# Patient Record
Sex: Female | Born: 1950 | Race: White | Hispanic: No | Smoking: Never smoker
Health system: Southern US, Community
[De-identification: ages and names within clinical notes are randomized; demographics above are authoritative.]

## PROBLEM LIST (undated history)

## (undated) DIAGNOSIS — N189 Chronic kidney disease, unspecified: Secondary | ICD-10-CM

## (undated) DIAGNOSIS — L659 Nonscarring hair loss, unspecified: Secondary | ICD-10-CM

## (undated) DIAGNOSIS — K219 Gastro-esophageal reflux disease without esophagitis: Secondary | ICD-10-CM

## (undated) DIAGNOSIS — E785 Hyperlipidemia, unspecified: Secondary | ICD-10-CM

## (undated) DIAGNOSIS — Z972 Presence of dental prosthetic device (complete) (partial): Secondary | ICD-10-CM

## (undated) DIAGNOSIS — M810 Age-related osteoporosis without current pathological fracture: Secondary | ICD-10-CM

## (undated) DIAGNOSIS — M8580 Other specified disorders of bone density and structure, unspecified site: Secondary | ICD-10-CM

## (undated) DIAGNOSIS — E213 Hyperparathyroidism, unspecified: Secondary | ICD-10-CM

## (undated) DIAGNOSIS — Z8719 Personal history of other diseases of the digestive system: Secondary | ICD-10-CM

## (undated) DIAGNOSIS — I10 Essential (primary) hypertension: Secondary | ICD-10-CM

## (undated) DIAGNOSIS — I701 Atherosclerosis of renal artery: Secondary | ICD-10-CM

## (undated) DIAGNOSIS — I2510 Atherosclerotic heart disease of native coronary artery without angina pectoris: Secondary | ICD-10-CM

## (undated) HISTORY — PX: CORONARY ARTERY BYPASS GRAFT: SHX141

## (undated) HISTORY — PX: APPENDECTOMY: SHX54

## (undated) HISTORY — PX: STENT PLACEMENT VASCULAR (ARMC HX): HXRAD1737

## (undated) HISTORY — PX: TONSILLECTOMY: SUR1361

## (undated) HISTORY — PX: PARATHYROIDECTOMY: SHX19

---

## 1998-09-09 ENCOUNTER — Inpatient Hospital Stay: Admission: RE | Admit: 1998-09-09 | Discharge: 1998-09-10 | Payer: Self-pay | Admitting: *Deleted

## 1999-09-08 ENCOUNTER — Observation Stay: Admission: RE | Admit: 1999-09-08 | Discharge: 1999-09-09 | Payer: Self-pay | Admitting: *Deleted

## 1999-09-08 ENCOUNTER — Encounter: Payer: Self-pay | Admitting: *Deleted

## 1999-10-20 ENCOUNTER — Observation Stay: Admission: RE | Admit: 1999-10-20 | Discharge: 1999-10-21 | Payer: Self-pay | Admitting: *Deleted

## 2005-07-13 ENCOUNTER — Ambulatory Visit: Payer: Self-pay | Admitting: Internal Medicine

## 2005-09-01 ENCOUNTER — Emergency Department: Payer: Self-pay | Admitting: Unknown Physician Specialty

## 2007-06-27 ENCOUNTER — Ambulatory Visit: Payer: Self-pay | Admitting: Internal Medicine

## 2008-08-13 ENCOUNTER — Ambulatory Visit: Payer: Self-pay | Admitting: Internal Medicine

## 2009-09-03 ENCOUNTER — Ambulatory Visit: Payer: Self-pay | Admitting: Vascular Surgery

## 2009-10-29 ENCOUNTER — Ambulatory Visit: Payer: Self-pay | Admitting: Vascular Surgery

## 2010-12-08 ENCOUNTER — Ambulatory Visit: Payer: Self-pay | Admitting: Internal Medicine

## 2012-03-08 ENCOUNTER — Ambulatory Visit: Payer: Self-pay | Admitting: Internal Medicine

## 2013-01-03 ENCOUNTER — Ambulatory Visit: Payer: Self-pay | Admitting: Gastroenterology

## 2013-02-28 ENCOUNTER — Ambulatory Visit: Payer: Self-pay | Admitting: Gastroenterology

## 2013-03-14 ENCOUNTER — Ambulatory Visit: Payer: Self-pay | Admitting: Internal Medicine

## 2013-06-06 ENCOUNTER — Ambulatory Visit: Payer: Self-pay | Admitting: Gastroenterology

## 2014-03-20 ENCOUNTER — Ambulatory Visit: Payer: Self-pay | Admitting: Internal Medicine

## 2014-05-17 DIAGNOSIS — M8580 Other specified disorders of bone density and structure, unspecified site: Secondary | ICD-10-CM | POA: Insufficient documentation

## 2014-05-17 DIAGNOSIS — N183 Chronic kidney disease, stage 3 unspecified: Secondary | ICD-10-CM | POA: Insufficient documentation

## 2014-05-17 DIAGNOSIS — I10 Essential (primary) hypertension: Secondary | ICD-10-CM | POA: Insufficient documentation

## 2015-03-30 ENCOUNTER — Ambulatory Visit
Admit: 2015-03-30 | Disposition: A | Discharge status: Home / Self Care | Payer: Self-pay | Attending: Internal Medicine | Admitting: Internal Medicine

## 2016-02-23 DIAGNOSIS — Z0000 Encounter for general adult medical examination without abnormal findings: Secondary | ICD-10-CM | POA: Insufficient documentation

## 2016-04-07 ENCOUNTER — Other Ambulatory Visit: Payer: Self-pay | Admitting: Internal Medicine

## 2016-04-07 DIAGNOSIS — Z1231 Encounter for screening mammogram for malignant neoplasm of breast: Secondary | ICD-10-CM

## 2016-04-15 ENCOUNTER — Ambulatory Visit
Admission: RE | Admit: 2016-04-15 | Discharge: 2016-04-15 | Disposition: A | Discharge status: Home / Self Care | Payer: BC Managed Care – PPO | Source: Ambulatory Visit | Attending: Internal Medicine | Admitting: Internal Medicine

## 2016-04-15 DIAGNOSIS — Z1231 Encounter for screening mammogram for malignant neoplasm of breast: Secondary | ICD-10-CM | POA: Insufficient documentation

## 2017-02-23 ENCOUNTER — Other Ambulatory Visit: Payer: Self-pay | Admitting: Internal Medicine

## 2017-02-23 DIAGNOSIS — Z1231 Encounter for screening mammogram for malignant neoplasm of breast: Secondary | ICD-10-CM

## 2017-04-18 ENCOUNTER — Ambulatory Visit: Payer: BC Managed Care – PPO

## 2017-04-24 ENCOUNTER — Ambulatory Visit
Admission: RE | Admit: 2017-04-24 | Discharge: 2017-04-24 | Disposition: A | Discharge status: Home / Self Care | Payer: BC Managed Care – PPO | Source: Ambulatory Visit | Attending: Internal Medicine | Admitting: Internal Medicine

## 2017-04-24 DIAGNOSIS — Z1231 Encounter for screening mammogram for malignant neoplasm of breast: Secondary | ICD-10-CM | POA: Diagnosis not present

## 2018-03-06 ENCOUNTER — Encounter: Payer: Self-pay | Admitting: *Deleted

## 2018-03-07 ENCOUNTER — Encounter: Payer: Self-pay | Admitting: *Deleted

## 2018-03-07 ENCOUNTER — Ambulatory Visit
Admission: RE | Admit: 2018-03-07 | Discharge: 2018-03-07 | Disposition: A | Discharge status: Home / Self Care | Payer: Medicare Other | Source: Ambulatory Visit | Attending: Internal Medicine | Admitting: Internal Medicine

## 2018-03-07 ENCOUNTER — Other Ambulatory Visit: Payer: Self-pay

## 2018-03-07 ENCOUNTER — Ambulatory Visit: Payer: Medicare Other | Admitting: Anesthesiology

## 2018-03-07 ENCOUNTER — Encounter
Admission: RE | Disposition: A | Discharge status: Home / Self Care | Payer: Self-pay | Source: Ambulatory Visit | Attending: Internal Medicine

## 2018-03-07 DIAGNOSIS — I2510 Atherosclerotic heart disease of native coronary artery without angina pectoris: Secondary | ICD-10-CM | POA: Diagnosis not present

## 2018-03-07 DIAGNOSIS — K449 Diaphragmatic hernia without obstruction or gangrene: Secondary | ICD-10-CM | POA: Diagnosis not present

## 2018-03-07 DIAGNOSIS — Z951 Presence of aortocoronary bypass graft: Secondary | ICD-10-CM | POA: Diagnosis not present

## 2018-03-07 DIAGNOSIS — I129 Hypertensive chronic kidney disease with stage 1 through stage 4 chronic kidney disease, or unspecified chronic kidney disease: Secondary | ICD-10-CM | POA: Diagnosis not present

## 2018-03-07 DIAGNOSIS — Z7902 Long term (current) use of antithrombotics/antiplatelets: Secondary | ICD-10-CM | POA: Insufficient documentation

## 2018-03-07 DIAGNOSIS — K222 Esophageal obstruction: Secondary | ICD-10-CM | POA: Diagnosis not present

## 2018-03-07 DIAGNOSIS — Z955 Presence of coronary angioplasty implant and graft: Secondary | ICD-10-CM | POA: Diagnosis not present

## 2018-03-07 DIAGNOSIS — N189 Chronic kidney disease, unspecified: Secondary | ICD-10-CM | POA: Diagnosis not present

## 2018-03-07 DIAGNOSIS — Z79899 Other long term (current) drug therapy: Secondary | ICD-10-CM | POA: Insufficient documentation

## 2018-03-07 DIAGNOSIS — I739 Peripheral vascular disease, unspecified: Secondary | ICD-10-CM | POA: Insufficient documentation

## 2018-03-07 DIAGNOSIS — R1310 Dysphagia, unspecified: Secondary | ICD-10-CM | POA: Diagnosis present

## 2018-03-07 HISTORY — DX: Nonscarring hair loss, unspecified: L65.9

## 2018-03-07 HISTORY — DX: Atherosclerosis of renal artery: I70.1

## 2018-03-07 HISTORY — DX: Chronic kidney disease, unspecified: N18.9

## 2018-03-07 HISTORY — DX: Essential (primary) hypertension: I10

## 2018-03-07 HISTORY — DX: Atherosclerotic heart disease of native coronary artery without angina pectoris: I25.10

## 2018-03-07 HISTORY — DX: Hyperparathyroidism, unspecified: E21.3

## 2018-03-07 HISTORY — PX: ESOPHAGOGASTRODUODENOSCOPY (EGD) WITH PROPOFOL: SHX5813

## 2018-03-07 SURGERY — ESOPHAGOGASTRODUODENOSCOPY (EGD) WITH PROPOFOL
Anesthesia: General

## 2018-03-07 MED ORDER — LIDOCAINE HCL (PF) 1 % IJ SOLN
INTRAMUSCULAR | Status: AC
  Filled 2018-03-07: qty 2

## 2018-03-07 MED ORDER — PROPOFOL 500 MG/50ML IV EMUL
INTRAVENOUS | Status: AC
  Filled 2018-03-07: qty 50

## 2018-03-07 MED ORDER — SODIUM CHLORIDE 0.9 % IV SOLN
INTRAVENOUS | Status: DC
  Administered 2018-03-07: 10:00:00 via INTRAVENOUS

## 2018-03-07 MED ORDER — LIDOCAINE HCL (PF) 2 % IJ SOLN
INTRAMUSCULAR | Status: AC
  Filled 2018-03-07: qty 10

## 2018-03-07 MED ORDER — PROPOFOL 500 MG/50ML IV EMUL
INTRAVENOUS | Status: DC | PRN
  Administered 2018-03-07: 200 ug/kg/min via INTRAVENOUS

## 2018-03-07 MED ORDER — LIDOCAINE HCL (CARDIAC) 20 MG/ML IV SOLN
INTRAVENOUS | Status: DC | PRN
  Administered 2018-03-07: 100 mg via INTRAVENOUS

## 2018-03-07 MED ORDER — PROPOFOL 10 MG/ML IV BOLUS
INTRAVENOUS | Status: DC | PRN
  Administered 2018-03-07: 60 mg via INTRAVENOUS

## 2018-03-07 NOTE — Anesthesia Preprocedure Evaluation (Signed)
Anesthesia Evaluation  Patient identified by MRN, date of birth, ID band Patient awake    Reviewed: Allergy & Precautions, H&P , NPO status , Patient's Chart, lab work & pertinent test results  History of Anesthesia Complications Negative for: history of anesthetic complications  Airway Mallampati: III  TM Distance: <3 FB Neck ROM: limited    Dental  (+) Chipped, Poor Dentition, Missing, Partial Lower, Upper Dentures   Pulmonary neg pulmonary ROS, neg shortness of breath,           Cardiovascular Exercise Tolerance: Good hypertension, (-) angina+ CAD, + Cardiac Stents, + CABG and + Peripheral Vascular Disease  (-) DOE      Neuro/Psych negative neurological ROS  negative psych ROS   GI/Hepatic negative GI ROS, Neg liver ROS,   Endo/Other  negative endocrine ROS  Renal/GU Renal diseasenegative Renal ROS  negative genitourinary   Musculoskeletal   Abdominal   Peds  Hematology negative hematology ROS (+)   Anesthesia Other Findings Past Medical History: No date: Alopecia No date: Chronic kidney disease     Comment:  history nephrolithiasis No date: Coronary artery disease No date: Hyperparathyroidism ([place])     Comment:  total calcium highest 12.2in 2/05, PTH 67. b. Sestamibi               6/04 revealed no adenoma osteoporosis with t score of               -2.67 in femoral neck and  No date: Hypertension No date: Renal artery stenosis (HCC)  Past Surgical History: No date: APPENDECTOMY No date: CORONARY ARTERY BYPASS GRAFT No date: PARATHYROIDECTOMY     Comment:  particial  No date: TONSILLECTOMY  BMI    Body Mass Index:  18.95 kg/m      Reproductive/Obstetrics negative OB ROS                             Anesthesia Physical Anesthesia Plan  ASA: III  Anesthesia Plan: General   Post-op Pain Management:    Induction: Intravenous  PONV Risk Score and Plan: Propofol  infusion and TIVA  Airway Management Planned: Natural Airway and Nasal Cannula  Additional Equipment:   Intra-op Plan:   Post-operative Plan:   Informed Consent: I have reviewed the patients History and Physical, chart, labs and discussed the procedure including the risks, benefits and alternatives for the proposed anesthesia with the patient or authorized representative who has indicated his/her understanding and acceptance.   Dental Advisory Given  Plan Discussed with: Anesthesiologist, CRNA and Surgeon  Anesthesia Plan Comments: (Patient consented for risks of anesthesia including but not limited to:  - adverse reactions to medications - risk of intubation if required - damage to teeth, lips or other oral mucosa - sore throat or hoarseness - Damage to heart, brain, lungs or loss of life  Patient voiced understanding.)        Anesthesia Quick Evaluation

## 2018-03-07 NOTE — Anesthesia Post-op Follow-up Note (Signed)
Anesthesia QCDR form completed.        

## 2018-03-07 NOTE — Transfer of Care (Signed)
Immediate Anesthesia Transfer of Care Note  Patient: Morgan Ryan  Procedure(s) Performed: ESOPHAGOGASTRODUODENOSCOPY (EGD) WITH PROPOFOL (N/A )  Patient Location: PACU and Endoscopy Unit  Anesthesia Type:General  Level of Consciousness: awake  Airway & Oxygen Therapy: Patient Spontanous Breathing  Post-op Assessment: Report given to RN  Post vital signs: stable  Last Vitals:  Vitals Value Taken Time  BP    Temp    Pulse 66 03/07/2018 11:56 AM  Resp 12 03/07/2018 11:56 AM  SpO2 98 % 03/07/2018 11:56 AM  Vitals shown include unvalidated device data.  Last Pain:  Vitals:   03/07/18 0958  TempSrc: Tympanic  PainSc: 0-No pain         Complications: No apparent anesthesia complications

## 2018-03-07 NOTE — H&P (Signed)
Outpatient short stay form Pre-procedure 03/07/2018 9:39 AM Teodoro K. Alice Reichert, M.D.  Primary Physician: Frazier Richards, M.D.  Reason for visit:  Dysphagia.  History of present illness:  Morgan Ryan is a pleasant 67 y/o female patient presenting with dysphagia for several months. This is located in the chest region.   No current facility-administered medications for this encounter.   Medications Prior to Admission  Medication Sig Dispense Refill Last Dose  . atorvastatin (LIPITOR) 40 MG tablet Take 40 mg by mouth daily.     . Biotin 1000 MCG CHEW Chew by mouth.     . clopidogrel (PLAVIX) 75 MG tablet Take 75 mg by mouth daily.     . propranolol (INDERAL) 20 MG tablet Take 20 mg by mouth 2 (two) times daily.     . raloxifene (EVISTA) 60 MG tablet Take 60 mg by mouth daily.        Allergies  Allergen Reactions  . Crestor [Rosuvastatin Calcium]   . Naproxen      Past Medical History:  Diagnosis Date  . Alopecia   . Chronic kidney disease    history nephrolithiasis  . Coronary artery disease   . Hyperparathyroidism ([place])    total calcium highest 12.2in 2/05, PTH 67. b. Sestamibi 6/04 revealed no adenoma osteoporosis with t score of -2.67 in femoral neck and   . Hypertension   . Renal artery stenosis (HCC)     Review of systems:      Physical Exam  Gen: Alert, oriented. Appears stated age.  HEENT: [place]/AT. PERRLA. Lungs: CTA, no wheezes. CV: RR nl S1, S2. Abd: soft, benign, no masses. BS+ Ext: No edema. Pulses 2+    Planned procedures: eGD. ....The patient understands the nature of the planned procedure, indications, risks, alternatives and potential complications including but not limited to bleeding, infection, perforation, damage to internal organs and possible oversedation/side effects from anesthesia. The patient agrees and gives consent to proceed.  Please refer to procedure notes for findings, recommendations and patient disposition/instructions.    Teodoro  K. Alice Reichert, M.D. Gastroenterology 03/07/2018  9:39 AM

## 2018-03-07 NOTE — Op Note (Signed)
[facility] Gastroenterology Patient Name: Morgan Ryan Procedure Date: 03/07/2018 11:21 AM MRN: [id number] Account #: [id number] Date of Birth: 10-02-1951 Admit Type: Outpatient Age: 67 Room: [facility] ENDO ROOM 3 Gender: Female Note Status: Finalized Procedure:            Upper GI endoscopy Indications:          Dysphagia, Suspected esophageal reflux Providers:            Benay Pike. Raseel Jans MD, MD Medicines:            Propofol per Anesthesia Complications:        No immediate complications. Procedure:            Pre-Anesthesia Assessment:                       - The risks and benefits of the procedure and the                        sedation options and risks were discussed with the                        patient. All questions were answered and informed                        consent was obtained.                       - Patient identification and proposed procedure were                        verified prior to the procedure by the nurse. The                        procedure was verified in the procedure room.                       - ASA Grade Assessment: III - A patient with severe                        systemic disease.                       - After reviewing the risks and benefits, the patient                        was deemed in satisfactory condition to undergo the                        procedure.                       After obtaining informed consent, the endoscope was                        passed under direct vision. Throughout the procedure,                        the patient's blood pressure, pulse, and oxygen                        saturations were monitored continuously. The Endoscope  was introduced through the mouth, and advanced to the                        third part of duodenum. The upper GI endoscopy was                        accomplished without difficulty. The patient tolerated                        the procedure  well. Findings:      One moderate (circumferential scarring or stenosis; an endoscope may       pass) benign-appearing, intrinsic stenosis was found at the       gastroesophageal junction. This measured 1.1 cm (inner diameter) x less       than one cm (in length) and was traversed. A TTS dilator was passed       through the scope. Dilation with a 09-22-11 mm balloon and a 12-13.5-15       mm balloon dilator was performed to 15 mm. The dilation site was       examined following endoscope reinsertion and showed complete resolution       of luminal narrowing. Estimated blood loss was minimal.      A large hiatal hernia was present.      The exam was otherwise without abnormality.      The examined duodenum was normal. Impression:           - Benign-appearing esophageal stenosis. Dilated.                       - Large hiatal hernia.                       - The examination was otherwise normal.                       - Normal examined duodenum.                       - No specimens collected. Recommendation:       - Patient has a contact number available for                        emergencies. The signs and symptoms of potential                        delayed complications were discussed with the patient.                        Return to normal activities tomorrow. Written discharge                        instructions were provided to the patient.                       - Resume previous diet.                       - Continue present medications.                       - Await pathology results.                       -  Return to GI office in 1 month.                       - The findings and recommendations were discussed with                        the patient and their family. Procedure Code(s):    --- Professional ---                       [phone], Esophagogastroduodenoscopy, flexible, transoral;                        with transendoscopic balloon dilation of esophagus                         (less than 30 mm diameter) Diagnosis Code(s):    --- Professional ---                       R13.10, Dysphagia, unspecified                       K44.9, Diaphragmatic hernia without obstruction or                        gangrene                       K22.2, Esophageal obstruction CPT copyright 2016 American Medical Association. All rights reserved. The codes documented in this report are preliminary and upon coder review may  be revised to meet current compliance requirements. Efrain Sella MD, MD 03/07/2018 11:54:12 AM This report has been signed electronically. Number of Addenda: 0 Note Initiated On: 03/07/2018 11:21 AM      [facility]

## 2018-03-07 NOTE — Interval H&P Note (Signed)
History and Physical Interval Note:  03/07/2018 9:40 AM  Morgan Ryan  has presented today for surgery, with the diagnosis of DYSPEPSIA  The various methods of treatment have been discussed with the patient and family. After consideration of risks, benefits and other options for treatment, the patient has consented to  Procedure(s): ESOPHAGOGASTRODUODENOSCOPY (EGD) WITH PROPOFOL (N/A) as a surgical intervention .  The patient's history has been reviewed, patient examined, no change in status, stable for surgery.  I have reviewed the patient's chart and labs.  Questions were answered to the patient's satisfaction.     [place], [place]

## 2018-03-08 LAB — SURGICAL PATHOLOGY

## 2018-03-08 NOTE — Anesthesia Postprocedure Evaluation (Signed)
Anesthesia Post Note  Patient: Morgan Ryan  Procedure(s) Performed: ESOPHAGOGASTRODUODENOSCOPY (EGD) WITH PROPOFOL (N/A )  Patient location during evaluation: Endoscopy Anesthesia Type: General Level of consciousness: awake and alert Pain management: pain level controlled Vital Signs Assessment: post-procedure vital signs reviewed and stable Respiratory status: spontaneous breathing, nonlabored ventilation, respiratory function stable and patient connected to nasal cannula oxygen Cardiovascular status: blood pressure returned to baseline and stable Postop Assessment: no apparent nausea or vomiting Anesthetic complications: no     Last Vitals:  Vitals:   03/07/18 1156 03/07/18 1206  BP: 125/65 120/67  Pulse:  69  Resp:  14  Temp: (!) 36.1 C   SpO2:  98%    Last Pain:  Vitals:   03/08/18 0724  TempSrc:   PainSc: 0-No pain                 Precious Haws Talajah Slimp

## 2018-09-03 ENCOUNTER — Ambulatory Visit (INDEPENDENT_AMBULATORY_CARE_PROVIDER_SITE_OTHER): Payer: Medicare Other | Admitting: Vascular Surgery

## 2018-09-03 ENCOUNTER — Encounter (INDEPENDENT_AMBULATORY_CARE_PROVIDER_SITE_OTHER): Payer: Self-pay | Admitting: Vascular Surgery

## 2018-09-03 DIAGNOSIS — I25810 Atherosclerosis of coronary artery bypass graft(s) without angina pectoris: Secondary | ICD-10-CM | POA: Insufficient documentation

## 2018-09-03 DIAGNOSIS — I25118 Atherosclerotic heart disease of native coronary artery with other forms of angina pectoris: Secondary | ICD-10-CM

## 2018-09-03 DIAGNOSIS — E782 Mixed hyperlipidemia: Secondary | ICD-10-CM

## 2018-09-03 DIAGNOSIS — I739 Peripheral vascular disease, unspecified: Secondary | ICD-10-CM | POA: Diagnosis not present

## 2018-09-03 DIAGNOSIS — I701 Atherosclerosis of renal artery: Secondary | ICD-10-CM | POA: Insufficient documentation

## 2018-09-03 DIAGNOSIS — Z79899 Other long term (current) drug therapy: Secondary | ICD-10-CM

## 2018-09-03 DIAGNOSIS — E785 Hyperlipidemia, unspecified: Secondary | ICD-10-CM | POA: Insufficient documentation

## 2018-09-03 DIAGNOSIS — I70213 Atherosclerosis of native arteries of extremities with intermittent claudication, bilateral legs: Secondary | ICD-10-CM

## 2018-09-03 NOTE — Progress Notes (Signed)
MRN : [id number]  Morgan Ryan is a 66 y.o. ([birth year]) female who presents with chief complaint of  Chief Complaint  Patient presents with  . New Patient (Initial Visit)    ref Ouida Sills for renal artery stenosis  .  History of Present Illness: The patient returns to the office more than 5 years since her last visit for followup regarding renal vascular hypertension and renal artery stenosis as well as PAD. There have been no interval changes in the patient's blood pressure control.  He denies any major changes in is medications.  The patient denies headache or flushing.  No flank or unusual back pain.    I have personally reviewed the historical data from her and vascular.  This information is as follows.  She was initially seen in 2010 for malignant hypertension and was found to have recurrent stenosis in her bilateral existing renal artery stents.  Duplex ultrasound from August 19, 2009 demonstrated greater than 60% stenosis of the renal arteries bilaterally.  She subsequently underwent redo renal stenting initially the left renal artery was treated on September 03, 2009 at which time an atrium covered stent was placed successfully.  Approximately 2 months later she returned on October 29, 2009 and had re-intervention of the right renal artery with an atrium covered stent placed successfully.  Her last follow-up with her in vascular was June 13, 2010 at which time renal artery duplex demonstrated widely patent stents bilaterally and her blood pressure at the time of the visit was 100/70.  During this time.  She also was noted to have claudication-like symptoms.  ABI dated 07/22/2009 showed a right index of 1.09 and a left index 1.12 with triphasic pedal signals.  She was also evaluated for varicose veins and noted to have a duplex ultrasound dated July 22, 2009 that was negative for DVT some scattered reflux was identified.  Since that time she has not followed with a vascular surgeon.  At  her last visit with Dr. Ouida Sills she raised concerns regarding her vascular disease and he requested that I reevaluate her.  There have been no significant changes to the patient's overall health care, her coronary artery disease appears to be stable status post history of CABG no new diagnoses of cancer or other major systemic illness.  No interval shortening of the patient's walking distance or new symptoms consistent with claudication.  The patient denies the  development of rest pain symptoms. No new ulcers or wounds have occurred since the last visit.  The patient denies amaurosis fugax or recent TIA symptoms. There are no recent neurological changes noted. The patient denies history of DVT, PE or superficial thrombophlebitis. The patient denies recent episodes of angina or shortness of breath.      Current Meds  Medication Sig  . atorvastatin (LIPITOR) 40 MG tablet Take 40 mg by mouth daily.  . Biotin 1000 MCG CHEW Chew by mouth.  . clopidogrel (PLAVIX) 75 MG tablet Take 75 mg by mouth daily.  . propranolol (INDERAL) 20 MG tablet Take 20 mg by mouth 2 (two) times daily.  . raloxifene (EVISTA) 60 MG tablet Take 60 mg by mouth daily.    Past Medical History:  Diagnosis Date  . Alopecia   . Chronic kidney disease    history nephrolithiasis  . Coronary artery disease   . Hyperparathyroidism ([place])    total calcium highest 12.2in 2/05, PTH 67. b. Sestamibi 6/04 revealed no adenoma osteoporosis with t score of -2.67 in  femoral neck and   . Hypertension   . Renal artery stenosis [facility])     Past Surgical History:  Procedure Laterality Date  . APPENDECTOMY    . CORONARY ARTERY BYPASS GRAFT    . ESOPHAGOGASTRODUODENOSCOPY (EGD) WITH PROPOFOL N/A 03/07/2018   Procedure: ESOPHAGOGASTRODUODENOSCOPY (EGD) WITH PROPOFOL;  Surgeon: [place], Benay Pike, MD;  Location: [facility] ENDOSCOPY;  Service: Gastroenterology;  Laterality: N/A;  . PARATHYROIDECTOMY     particial   . TONSILLECTOMY       Social History Social History   Tobacco Use  . Smoking status: Never Smoker  . Smokeless tobacco: Never Used  Substance Use Topics  . Alcohol use: Never    Frequency: Never  . Drug use: Never    Family History Family History  Problem Relation Age of Onset  . Heart disease Mother   . Hypertension Father   . Hyperlipidemia Father   . Cancer Father   . Diabetes Father   . Heart attack Father   . Heart disease Paternal Grandmother   No family history of bleeding/clotting disorders, porphyria or autoimmune disease   Allergies  Allergen Reactions  . Crestor [Rosuvastatin Calcium]   . Naproxen      REVIEW OF SYSTEMS (Negative unless checked)  Constitutional: [] Weight loss  [] Fever  [] Chills Cardiac: [] Chest pain   [] Chest pressure   [] Palpitations   [] Shortness of breath when laying flat   [] Shortness of breath with exertion. Vascular:  [x] Pain in legs with walking   [] Pain in legs at rest  [] History of DVT   [] Phlebitis   [] Swelling in legs   [x] Varicose veins   [] Non-healing ulcers Pulmonary:   [] Uses home oxygen   [] Productive cough   [] Hemoptysis   [] Wheeze  [] COPD   [] Asthma Neurologic:  [] Dizziness   [] Seizures   [] History of stroke   [] History of TIA  [] Aphasia   [] Vissual changes   [] Weakness or numbness in arm   [] Weakness or numbness in leg Musculoskeletal:   [] Joint swelling   [x] Joint pain   [] Low back pain Hematologic:  [] Easy bruising  [] Easy bleeding   [] Hypercoagulable state   [] Anemic Gastrointestinal:  [] Diarrhea   [] Vomiting  [] Gastroesophageal reflux/heartburn   [] Difficulty swallowing. Genitourinary:  [] Chronic kidney disease   [] Difficult urination  [] Frequent urination   [] Blood in urine Skin:  [] Rashes   [] Ulcers  Psychological:  [] History of anxiety   []  History of major depression.  Physical Examination  Vitals:   09/03/18 1031  BP: 124/84  Pulse: 69  Resp: 16  Weight: 108 lb (49 kg)  Height: 5' 3.5" (1.613 m)   Body mass index is 18.83  kg/m. Gen: WD/WN, NAD Head: [place]/AT, No temporalis wasting.  Ear/Nose/Throat: Hearing grossly intact, nares w/o erythema or drainage, poor dentition Eyes: PER, EOMI, sclera nonicteric.  Neck: Supple, no masses.  No bruit or JVD.  Pulmonary:  Good air movement, clear to auscultation bilaterally, no use of accessory muscles.  Cardiac: RRR, normal S1, S2, no Murmurs. Vascular: Sporadic varicosities present bilaterally, 5 to 10 mm in diameter.  Mild venous stasis changes to the legs bilaterally.  2+ soft pitting edema Vessel Right Left  Radial Palpable Palpable  PT Trace Palpable Trace Palpable  DP Trace Palpable tracePalpable  Gastrointestinal: soft, non-distended. No guarding/no peritoneal signs.  Musculoskeletal: M/S 5/5 throughout.  No deformity or atrophy.  Neurologic: CN 2-12 intact. Pain and light touch intact in extremities.  Symmetrical.  Speech is fluent. Motor exam as listed above. Psychiatric: Judgment intact,  Mood & affect appropriate for pt's clinical situation. Dermatologic: Mild venous rashes no ulcers noted.  No changes consistent with cellulitis. Lymph : No Cervical lymphadenopathy, no lichenification or skin changes of chronic lymphedema.  CBC No results found for: WBC, HGB, HCT, MCV, PLT  BMET No results found for: NA, K, CL, CO2, GLUCOSE, BUN, CREATININE, CALCIUM, GFRNONAA, GFRAA CrCl cannot be calculated (No successful lab value found.).  COAG No results found for: INR, PROTIME  Radiology No results found.  Outside Studies/Documentation 24 pages of outside documents were reviewed.  They showed the operative reports of her re-interventions I personally reviewed these as well as the noninvasive studies demonstrating her ABIs and venous scans as well as the pre-and post intervention renal artery duplexes.  Office notes were also reviewed.  My findings are stated above.  Assessment/Plan 1. Renal artery stenosis (HCC) BP today was acceptable with systolic reading  <062 and diastolic reading <37 while taking 3 medications.  Given that optimal control of the patient's hypertension is important to minimize the risk of heart attack and/or CVA.  The patient's is overdo for her noninvasive studies to evaluate for the possibility of a hemodynamically significant stricture or stenosis.  Duplex ultrasound was discussed with the patient, the risks and benefits were reviewed and all questions were answered.  The patient has agreed to proceed with ultrasound.    The patient will continue the current medications, no changes at this time.  The primary medical service will continue aggressive antihypertensive therapy as per the [facility] guidelines    A total of 70 minutes was spent with this patient and greater than 50% was spent in counseling and coordination of care with the patient.  Discussion included the treatment options for vascular disease including indications for surgery and intervention.  Also discussed is the appropriate timing of treatment.  In addition medical therapy was discussed.  - VAS US RENAL ARTERY DUPLEX; Future  2. PAD (peripheral artery disease) (HCC)  Recommend:  The patient has evidence of atherosclerosis of the lower extremities with claudication.  The patient does not voice lifestyle limiting changes at this point in time.  Noninvasive studies are overdo  No invasive studies, angiography or surgery at this time.  The patient should continue walking and begin a more formal exercise program.  The patient should continue antiplatelet therapy and aggressive treatment of the lipid abnormalities  No changes in the patient's medications at this time  The patient should continue wearing graduated compression socks 10-15 mmHg strength to control the mild edema.   - VAS [place] ABI WITH/WO TBI; Future  3. Coronary artery disease of native artery of native heart with stable angina pectoris (HCC) Continue cardiac and antihypertensive medications as  already ordered and reviewed, no changes at this time.  Continue statin as ordered and reviewed, no changes at this time  Nitrates PRN for chest pain   4. Mixed hyperlipidemia Continue statin as ordered and reviewed, no changes at this time     Hortencia Pilar, MD  09/03/2018 12:25 PM

## 2018-10-01 ENCOUNTER — Encounter (INDEPENDENT_AMBULATORY_CARE_PROVIDER_SITE_OTHER): Payer: Medicare Other

## 2018-10-08 ENCOUNTER — Encounter (INDEPENDENT_AMBULATORY_CARE_PROVIDER_SITE_OTHER): Payer: Self-pay | Admitting: Vascular Surgery

## 2018-10-08 ENCOUNTER — Ambulatory Visit (INDEPENDENT_AMBULATORY_CARE_PROVIDER_SITE_OTHER): Payer: Medicare Other

## 2018-10-08 ENCOUNTER — Ambulatory Visit (INDEPENDENT_AMBULATORY_CARE_PROVIDER_SITE_OTHER): Payer: Medicare Other | Admitting: Vascular Surgery

## 2018-10-08 VITALS — BP 149/70 | HR 50 | Resp 16 | Ht 63.5 in | Wt 108.6 lb

## 2018-10-08 DIAGNOSIS — I739 Peripheral vascular disease, unspecified: Secondary | ICD-10-CM

## 2018-10-08 DIAGNOSIS — E782 Mixed hyperlipidemia: Secondary | ICD-10-CM

## 2018-10-08 DIAGNOSIS — I701 Atherosclerosis of renal artery: Secondary | ICD-10-CM | POA: Diagnosis not present

## 2018-10-08 DIAGNOSIS — I25708 Atherosclerosis of coronary artery bypass graft(s), unspecified, with other forms of angina pectoris: Secondary | ICD-10-CM | POA: Diagnosis not present

## 2018-10-18 ENCOUNTER — Encounter (INDEPENDENT_AMBULATORY_CARE_PROVIDER_SITE_OTHER): Payer: Self-pay | Admitting: Vascular Surgery

## 2018-10-18 NOTE — Progress Notes (Signed)
MRN : [id number]  Morgan Ryan is a 67 y.o. (Jan 22, 1951) female who presents with chief complaint of  Chief Complaint  Patient presents with  . Follow-up    ultrasound follow up  .  History of Present Illness:    The patient returns to the office more than 5 years since her last visit for followup regarding renal vascular hypertension and renal artery stenosis as well as PAD. There have been no interval changes in the patient's blood pressure control.  He denies any major changes in is medications.  The patient denies headache or flushing.  No flank or unusual back pain.    There have been no significant changes to the patient's overall health care.  The patient denies amaurosis fugax or recent TIA symptoms. There are no recent neurological changes noted. The patient denies history of DVT, PE or superficial thrombophlebitis. The patient denies recent episodes of angina or shortness of breath.   ABI Rt=1.01 and Lt=1.11  (previous ABI's Rt=1.09 and Lt=1.12) Duplex ultrasound of the renal arteries shows moderate bilateral stenosis, no change compared to last study  Current Meds  Medication Sig  . atorvastatin (LIPITOR) 40 MG tablet Take 40 mg by mouth daily.  . Biotin 1000 MCG CHEW Chew by mouth.  . clopidogrel (PLAVIX) 75 MG tablet Take 75 mg by mouth daily.  . propranolol (INDERAL) 20 MG tablet Take 20 mg by mouth 2 (two) times daily.  . raloxifene (EVISTA) 60 MG tablet Take 60 mg by mouth daily.    Past Medical History:  Diagnosis Date  . Alopecia   . Chronic kidney disease    history nephrolithiasis  . Coronary artery disease   . Hyperparathyroidism ([place])    total calcium highest 12.2in 2/05, PTH 68. b. Sestamibi 6/04 revealed no adenoma osteoporosis with t score of -2.67 in femoral neck and   . Hypertension   . Renal artery stenosis [facility])     Past Surgical History:  Procedure Laterality Date  . APPENDECTOMY    . CORONARY ARTERY BYPASS GRAFT    .  ESOPHAGOGASTRODUODENOSCOPY (EGD) WITH PROPOFOL N/A 03/07/2018   Procedure: ESOPHAGOGASTRODUODENOSCOPY (EGD) WITH PROPOFOL;  Surgeon: Toledo, Benay Pike, MD;  Location: [facility] ENDOSCOPY;  Service: Gastroenterology;  Laterality: N/A;  . PARATHYROIDECTOMY     particial   . TONSILLECTOMY      Social History Social History   Tobacco Use  . Smoking status: Never Smoker  . Smokeless tobacco: Never Used  Substance Use Topics  . Alcohol use: Never    Frequency: Never  . Drug use: Never    Family History Family History  Problem Relation Age of Onset  . Heart disease Mother   . Hypertension Father   . Hyperlipidemia Father   . Cancer Father   . Diabetes Father   . Heart attack Father   . Heart disease Paternal Grandmother     Allergies  Allergen Reactions  . Crestor [Rosuvastatin Calcium]   . Naproxen      REVIEW OF SYSTEMS (Negative unless checked)  Constitutional: [] Weight loss  [] Fever  [] Chills Cardiac: [] Chest pain   [] Chest pressure   [] Palpitations   [] Shortness of breath when laying flat   [] Shortness of breath with exertion. Vascular:  [x] Pain in legs with walking   [] Pain in legs at rest  [] History of DVT   [] Phlebitis   [] Swelling in legs   [] Varicose veins   [] Non-healing ulcers Pulmonary:   [] Uses home oxygen   [] Productive cough   []   Hemoptysis   [] Wheeze  [] COPD   [] Asthma Neurologic:  [] Dizziness   [] Seizures   [] History of stroke   [] History of TIA  [] Aphasia   [] Vissual changes   [] Weakness or numbness in arm   [] Weakness or numbness in leg Musculoskeletal:   [] Joint swelling   [] Joint pain   [] Low back pain Hematologic:  [] Easy bruising  [] Easy bleeding   [] Hypercoagulable state   [] Anemic Gastrointestinal:  [] Diarrhea   [] Vomiting  [] Gastroesophageal reflux/heartburn   [] Difficulty swallowing. Genitourinary:  [] Chronic kidney disease   [] Difficult urination  [] Frequent urination   [] Blood in urine Skin:  [] Rashes   [] Ulcers  Psychological:  [] History of anxiety    []  History of major depression.  Physical Examination  Vitals:   10/08/18 0847  BP: (!) 149/70  Pulse: (!) 50  Resp: 16  Weight: 108 lb 9.6 oz (49.3 kg)  Height: 5' 3.5" (1.613 m)   Body mass index is 18.94 kg/m. Gen: WD/WN, NAD Head: [place]/AT, No temporalis wasting.  Ear/Nose/Throat: Hearing grossly intact, nares w/o erythema or drainage Eyes: PER, EOMI, sclera nonicteric.  Neck: Supple, no large masses.   Pulmonary:  Good air movement, no audible wheezing bilaterally, no use of accessory muscles.  Cardiac: RRR, no JVD Vascular:  Vessel Right Left  Radial Palpable Palpable  PT Trace Palpable Trace Palpable  DP Trace Palpable Trace Palpable  Gastrointestinal: Non-distended. No guarding/no peritoneal signs.  Musculoskeletal: M/S 5/5 throughout.  No deformity or atrophy.  Neurologic: CN 2-12 intact. Symmetrical.  Speech is fluent. Motor exam as listed above. Psychiatric: Judgment intact, Mood & affect appropriate for pt's clinical situation. Dermatologic: No rashes or ulcers noted.  No changes consistent with cellulitis. Lymph : No lichenification or skin changes of chronic lymphedema.  CBC No results found for: WBC, HGB, HCT, MCV, PLT  BMET No results found for: NA, K, CL, CO2, GLUCOSE, BUN, CREATININE, CALCIUM, GFRNONAA, GFRAA CrCl cannot be calculated (No successful lab value found.).  COAG No results found for: INR, PROTIME  Radiology Vas [place] Abi With/wo Tbi  Result Date: 10/08/2018 LOWER EXTREMITY DOPPLER STUDY Indications: Peripheral artery disease. Other Factors: Patient states no problems with legs.  Vascular Interventions: None. Performing Technologist: Blondell Reveal RT, RDMS, RVT  Examination Guidelines: A complete evaluation includes at minimum, Doppler waveform signals and systolic blood pressure reading at the level of bilateral brachial, anterior tibial, and posterior tibial arteries, when vessel segments are accessible. Bilateral testing is considered an  integral part of a complete examination. Photoelectric Plethysmograph (PPG) waveforms and toe systolic pressure readings are included as required and additional duplex testing as needed. Limited examinations for reoccurring indications may be performed as noted.  ABI Findings: +--------+------------------+-----+---------+--------+ Right   Rt Pressure (mmHg)IndexWaveform Comment  +--------+------------------+-----+---------+--------+ [id number]                                      +--------+------------------+-----+---------+--------+ ATA     138               1.01 triphasic         +--------+------------------+-----+---------+--------+ PTA     131               0.96 triphasic         +--------+------------------+-----+---------+--------+ +--------+------------------+-----+---------+-------+ Left    Lt Pressure (mmHg)IndexWaveform Comment +--------+------------------+-----+---------+-------+ [id number]                                     +--------+------------------+-----+---------+-------+  ATA     152               1.11 triphasic        +--------+------------------+-----+---------+-------+ PTA     140               1.02 triphasic        +--------+------------------+-----+---------+-------+ +-------+-----------+-----------+------------+------------+ ABI/TBIToday's ABIToday's TBIPrevious ABIPrevious TBI +-------+-----------+-----------+------------+------------+ Right  1.01                  1.09                     +-------+-----------+-----------+------------+------------+ Left   1.11                  1.12                     +-------+-----------+-----------+------------+------------+ Bilateral ABIs appear essentially unchanged compared to prior study on 07/22/09.  Summary: Right: Resting right ankle-brachial index is within normal range. No evidence of significant right lower extremity arterial disease. Left: Resting left ankle-brachial index is  within normal range. No evidence of significant left lower extremity arterial disease.  *See table(s) above for measurements and observations.  Electronically signed by Hortencia Pilar MD on 10/08/2018 at 4:37:08 PM.   Final    Vas US Renal Artery Duplex  Result Date: 10/08/2018 ABDOMINAL VISCERAL Indications: Renal artery stenosis Vascular Interventions: H/O bilateral renal artery stents in 2010. Limitations: Air/bowel gas. Comparison Study: Ultrasound on 06/13/10: Widely patent renal artery stents Performing Technologist: Blondell Reveal RT, RDMS, RVT  Examination Guidelines: A complete evaluation includes B-mode imaging, spectral Doppler, color Doppler, and power Doppler as needed of all accessible portions of each vessel. Bilateral testing is considered an integral part of a complete examination. Limited examinations for reoccurring indications may be performed as noted.  Duplex Findings: +---------+--------+--------+------+--------+          PSV cm/sEDV cm/sPlaqueComments +---------+--------+--------+------+--------+ Aorta Mid   68                          +---------+--------+--------+------+--------+  +------------------+--------+--------+-------+ Right Renal ArteryPSV cm/sEDV cm/sComment +------------------+--------+--------+-------+ Proximal            236      55           +------------------+--------+--------+-------+ Mid                 101      13           +------------------+--------+--------+-------+ Distal               57      14           +------------------+--------+--------+-------+ +-----------------+--------+--------+-------+ Left Renal ArteryPSV cm/sEDV cm/sComment +-----------------+--------+--------+-------+ Proximal           251      82           +-----------------+--------+--------+-------+ Mid                 64      22           +-----------------+--------+--------+-------+ Distal              84      22            +-----------------+--------+--------+-------+ Technologist observations:Bilateral renal artery stents were not adequately visualized.  +------------------+----+------------------+----+ Right Kidney          Left Kidney            +------------------+----+------------------+----+  RAR                   RAR                    +------------------+----+------------------+----+ RAR (manual)      3.5 RAR (manual)      3.7  +------------------+----+------------------+----+ Cortex                Cortex                 +------------------+----+------------------+----+ Cortex thickness      Corex thickness        +------------------+----+------------------+----+ Kidney length (cm)9.20Kidney length (cm)9.00 +------------------+----+------------------+----+  Summary: Renal:  Right: Evidence of patent renal artery stent with velocities        suggestive of a >60% proximal renal artery stenosis. Normal        size right kidney. RRV flow present. Left:  Evidence of patent renal artery stent with velocities        suggestive of a >60% proximal renal artery stenosis. Normal        size of left kidney. LRV flow present.  *See table(s) above for measurements and observations.  Diagnosing physician: Hortencia Pilar MD  Electronically signed by Hortencia Pilar MD on 10/08/2018 at 4:37:17 PM.    Final      Assessment/Plan 1. Renal artery stenosis (HCC) BP today was acceptable with systolic reading <315 and diastolic reading <17 while taking 3 medications.  Given that optimal control of the patient's hypertension is important to minimize the risk of heart attack and/or CVA.  The patient's is overdo for her noninvasive studies to evaluate for the possibility of a hemodynamically significant stricture or stenosis.  Duplex ultrasound was discussed with the patient, the risks and benefits were reviewed and all questions were answered.  The patient has agreed to proceed with ultrasound.    The  patient will continue the current medications, no changes at this time.  The primary medical service will continue aggressive antihypertensive therapy as per the AHA guidelines   - VAS US RENAL ARTERY DUPLEX; Future  2. PAD (peripheral artery disease) (HCC) Recommend:  The patient has evidence of atherosclerosis of the lower extremities with claudication.  The patient does not voice lifestyle limiting changes at this point in time.  Noninvasive studies are overdo  No invasive studies, angiography or surgery at this time.  The patient should continue walking and begin a more formal exercise program.  The patient should continue antiplatelet therapy and aggressive treatment of the lipid abnormalities  No changes in the patient's medications at this time  The patient should continue wearing graduated   3. Coronary artery disease of bypass graft of native heart with stable angina pectoris (HCC) Continue cardiac and antihypertensive medications as already ordered and reviewed, no changes at this time.  Continue statin as ordered and reviewed, no changes at this time  Nitrates PRN for chest pain   4. Mixed hyperlipidemia Continue statin as ordered and reviewed, no changes at this time     Hortencia Pilar, MD  10/18/2018 10:23 PM

## 2019-09-05 ENCOUNTER — Other Ambulatory Visit: Payer: Self-pay | Admitting: Internal Medicine

## 2019-09-05 DIAGNOSIS — Z1231 Encounter for screening mammogram for malignant neoplasm of breast: Secondary | ICD-10-CM

## 2019-09-09 ENCOUNTER — Other Ambulatory Visit: Payer: Self-pay | Admitting: Sports Medicine

## 2019-09-09 DIAGNOSIS — M25461 Effusion, right knee: Secondary | ICD-10-CM

## 2019-09-09 DIAGNOSIS — G8929 Other chronic pain: Secondary | ICD-10-CM

## 2019-09-20 ENCOUNTER — Ambulatory Visit
Admission: RE | Admit: 2019-09-20 | Discharge: 2019-09-20 | Disposition: A | Discharge status: Home / Self Care | Payer: Medicare Other | Source: Ambulatory Visit | Attending: Sports Medicine | Admitting: Sports Medicine

## 2019-09-20 ENCOUNTER — Other Ambulatory Visit: Payer: Self-pay

## 2019-09-20 DIAGNOSIS — M25461 Effusion, right knee: Secondary | ICD-10-CM | POA: Insufficient documentation

## 2019-09-20 DIAGNOSIS — G8929 Other chronic pain: Secondary | ICD-10-CM

## 2019-09-20 DIAGNOSIS — M25561 Pain in right knee: Secondary | ICD-10-CM | POA: Insufficient documentation

## 2019-10-09 ENCOUNTER — Ambulatory Visit
Admission: RE | Admit: 2019-10-09 | Discharge: 2019-10-09 | Disposition: A | Discharge status: Home / Self Care | Payer: Medicare Other | Source: Ambulatory Visit | Attending: Internal Medicine | Admitting: Internal Medicine

## 2019-10-09 DIAGNOSIS — Z1231 Encounter for screening mammogram for malignant neoplasm of breast: Secondary | ICD-10-CM | POA: Diagnosis not present

## 2019-10-10 ENCOUNTER — Ambulatory Visit (INDEPENDENT_AMBULATORY_CARE_PROVIDER_SITE_OTHER): Payer: Medicare Other | Admitting: Vascular Surgery

## 2019-10-10 ENCOUNTER — Encounter (INDEPENDENT_AMBULATORY_CARE_PROVIDER_SITE_OTHER): Payer: Medicare Other

## 2019-10-28 ENCOUNTER — Encounter (INDEPENDENT_AMBULATORY_CARE_PROVIDER_SITE_OTHER): Payer: Self-pay | Admitting: Vascular Surgery

## 2019-10-28 ENCOUNTER — Other Ambulatory Visit: Payer: Self-pay

## 2019-10-28 ENCOUNTER — Ambulatory Visit (INDEPENDENT_AMBULATORY_CARE_PROVIDER_SITE_OTHER): Payer: Medicare Other

## 2019-10-28 ENCOUNTER — Ambulatory Visit (INDEPENDENT_AMBULATORY_CARE_PROVIDER_SITE_OTHER): Payer: Medicare Other | Admitting: Vascular Surgery

## 2019-10-28 VITALS — BP 155/75 | HR 64 | Resp 16 | Wt 110.4 lb

## 2019-10-28 DIAGNOSIS — I701 Atherosclerosis of renal artery: Secondary | ICD-10-CM

## 2019-10-28 DIAGNOSIS — I739 Peripheral vascular disease, unspecified: Secondary | ICD-10-CM

## 2019-10-28 DIAGNOSIS — I10 Essential (primary) hypertension: Secondary | ICD-10-CM

## 2019-10-28 DIAGNOSIS — I25708 Atherosclerosis of coronary artery bypass graft(s), unspecified, with other forms of angina pectoris: Secondary | ICD-10-CM

## 2019-10-28 DIAGNOSIS — E782 Mixed hyperlipidemia: Secondary | ICD-10-CM

## 2019-10-28 NOTE — Progress Notes (Signed)
MRN : [id number]  Morgan Ryan is a 68 y.o. (22-Apr-1951) female who presents with chief complaint of  Chief Complaint  Patient presents with  . Follow-up    ultrasound follow up  .  History of Present Illness:  The patient returns to the [facility] than 5 years since her last visitfor followup regarding renal vascular hypertension and renal artery stenosisas well as PAD. There have been no interval changes in the patient's blood pressure control. He denies any major changes in is medications. The patient denies headache or flushing. No flank or unusual back pain.   There have been no significant changes to the patient's overall health care.  The patient denies amaurosis fugax or recent TIA symptoms. There are no recent neurological changes noted. The patient denies history of DVT, PE or superficial thrombophlebitis. The patient denies recent episodes of angina or shortness of breath.   Previous ABI Rt=1.01 and Lt=1.11  (previous ABI's Rt=1.09 and Lt=1.12)  Duplex ultrasound of the renal arteries shows moderate bilateral stenosis, maybe slightly worse on the left compared to last study  Current Meds  Medication Sig  . atorvastatin (LIPITOR) 40 MG tablet Take 40 mg by mouth daily.  . Biotin 1000 MCG CHEW Chew by mouth.  . clopidogrel (PLAVIX) 75 MG tablet Take 75 mg by mouth daily.  . propranolol (INDERAL) 20 MG tablet Take 20 mg by mouth 2 (two) times daily.  . raloxifene (EVISTA) 60 MG tablet Take 60 mg by mouth daily.    Past Medical History:  Diagnosis Date  . Alopecia   . Chronic kidney disease    history nephrolithiasis  . Coronary artery disease   . Hyperparathyroidism ([place])    total calcium highest 12.2in 2/05, PTH 68. b. Sestamibi 6/04 revealed no adenoma osteoporosis with t score of -2.67 in femoral neck and   . Hypertension   . Renal artery stenosis [facility])     Past Surgical History:  Procedure Laterality Date  . APPENDECTOMY    . CORONARY  ARTERY BYPASS GRAFT    . ESOPHAGOGASTRODUODENOSCOPY (EGD) WITH PROPOFOL N/A 03/07/2018   Procedure: ESOPHAGOGASTRODUODENOSCOPY (EGD) WITH PROPOFOL;  Surgeon: Toledo, Benay Pike, MD;  Location: [facility] ENDOSCOPY;  Service: Gastroenterology;  Laterality: N/A;  . PARATHYROIDECTOMY     particial   . TONSILLECTOMY      Social History Social History   Tobacco Use  . Smoking status: Never Smoker  . Smokeless tobacco: Never Used  Substance Use Topics  . Alcohol use: Never    Frequency: Never  . Drug use: Never    Family History Family History  Problem Relation Age of Onset  . Heart disease Mother   . Hypertension Father   . Hyperlipidemia Father   . Cancer Father   . Diabetes Father   . Heart attack Father   . Heart disease Paternal Grandmother     Allergies  Allergen Reactions  . Crestor [Rosuvastatin Calcium]   . Naproxen      REVIEW OF SYSTEMS (Negative unless checked)  Constitutional: [] Weight loss  [] Fever  [] Chills Cardiac: [] Chest pain   [] Chest pressure   [] Palpitations   [] Shortness of breath when laying flat   [] Shortness of breath with exertion. Vascular:  [] Pain in legs with walking   [] Pain in legs at rest  [] History of DVT   [] Phlebitis   [] Swelling in legs   [] Varicose veins   [] Non-healing ulcers Pulmonary:   [] Uses home oxygen   [] Productive cough   [] Hemoptysis   []   Wheeze  [] COPD   [] Asthma Neurologic:  [] Dizziness   [] Seizures   [] History of stroke   [] History of TIA  [] Aphasia   [] Vissual changes   [] Weakness or numbness in arm   [] Weakness or numbness in leg Musculoskeletal:   [] Joint swelling   [] Joint pain   [] Low back pain Hematologic:  [] Easy bruising  [] Easy bleeding   [] Hypercoagulable state   [] Anemic Gastrointestinal:  [] Diarrhea   [] Vomiting  [] Gastroesophageal reflux/heartburn   [] Difficulty swallowing. Genitourinary:  [] Chronic kidney disease   [] Difficult urination  [] Frequent urination   [] Blood in urine Skin:  [] Rashes   [] Ulcers   Psychological:  [] History of anxiety   []  History of major depression.  Physical Examination  Vitals:   10/28/19 0937  BP: (!) 155/75  Pulse: 64  Resp: 16  Weight: 110 lb 6.4 oz (50.1 kg)   Body mass index is 19.25 kg/m. Gen: WD/WN, NAD Head: [place]/AT, No temporalis wasting.  Ear/Nose/Throat: Hearing grossly intact, nares w/o erythema or drainage Eyes: PER, EOMI, sclera nonicteric.  Neck: Supple, no large masses.   Pulmonary:  Good air movement, no audible wheezing bilaterally, no use of accessory muscles.  Cardiac: RRR, no JVD Vascular: no renal bruit Vessel Right Left  Radial Palpable Palpable  PT Not Palpable Not Palpable  DP Not Palpable Not Palpable  Gastrointestinal: Non-distended. No guarding/no peritoneal signs.  Musculoskeletal: M/S 5/5 throughout.  No deformity or atrophy.  Neurologic: CN 2-12 intact. Symmetrical.  Speech is fluent. Motor exam as listed above. Psychiatric: Judgment intact, Mood & affect appropriate for pt's clinical situation. Dermatologic: No rashes or ulcers noted.  No changes consistent with cellulitis. Lymph : No lichenification or skin changes of chronic lymphedema.  CBC No results found for: WBC, HGB, HCT, MCV, PLT  BMET No results found for: NA, K, CL, CO2, GLUCOSE, BUN, CREATININE, CALCIUM, GFRNONAA, GFRAA CrCl cannot be calculated (No successful lab value found.).  COAG No results found for: INR, PROTIME  Radiology Mm 3d Screen Breast Bilateral  Result Date: 10/09/2019 CLINICAL DATA:  Screening. EXAM: DIGITAL SCREENING BILATERAL MAMMOGRAM WITH TOMO AND CAD COMPARISON:  Previous exam(s). ACR Breast Density Category b: There are scattered areas of fibroglandular density. FINDINGS: There are no findings suspicious for malignancy. Images were processed with CAD. IMPRESSION: No mammographic evidence of malignancy. A result letter of this screening mammogram will be mailed directly to the patient. RECOMMENDATION: Screening mammogram in one  year. (Code:SM-B-01Y) BI-RADS CATEGORY  1: Negative. Electronically Signed   By: Lillia Mountain M.D.   On: 10/09/2019 13:50     Assessment/Plan 1. Renal artery stenosis (HCC) BP today was acceptable with systolic [id number] and diastolic [id number] while taking 3 medications.  Given that optimal control of the patient's hypertension is important to minimize the risk of heart attack and/or CVA.  The patient'sis overdo for hernoninvasive studiesto evaluate forthe possibility of a hemodynamically significant stricture or stenosis.  Duplex ultrasoundwas discussed with the patient, the risks and benefits were reviewed and all questions were answered. The patient has agreed to proceed with ultrasound.  The patient will continue the current medications, no changes at this time.  The primary medical service will continue aggressive antihypertensive therapy as per the AHA guidelines  - Renal; Future  2. PAD (peripheral artery disease) (HCC) Recommend:  The patient has evidence of atherosclerosis of the lower extremities with claudication. The patient does not voice lifestyle limiting changes at this point in time.  Noninvasive studiesare overdo  No invasive studies, angiography or  surgery at this time.  The patient should continue walking and begin a more formal exercise program.  The patient should continue antiplatelet therapy and aggressive treatment of the lipid abnormalities  No changes in the patient's medications at this time  The patient should continue wearing graduated   3. HTN (hypertension), benign Continue antihypertensive medications as already ordered, these medications have been reviewed and there are no changes at this time.   4. Coronary artery disease of bypass graft of native heart with stable angina pectoris ([place]) Continue cardiac and antihypertensive medications as already ordered and reviewed, no changes at this time.  Continue statin as  ordered and reviewed, no changes at this time  Nitrates PRN for chest pain   5. Mixed hyperlipidemia Continue statin as ordered and reviewed, no changes at this time   Hortencia Pilar, MD  10/28/2019 9:39 AM

## 2020-01-12 ENCOUNTER — Ambulatory Visit: Payer: Medicare Other

## 2020-01-13 ENCOUNTER — Encounter (INDEPENDENT_AMBULATORY_CARE_PROVIDER_SITE_OTHER): Payer: Medicare Other

## 2020-01-13 ENCOUNTER — Ambulatory Visit (INDEPENDENT_AMBULATORY_CARE_PROVIDER_SITE_OTHER): Payer: Medicare Other | Admitting: Vascular Surgery

## 2020-01-20 ENCOUNTER — Ambulatory Visit: Payer: Medicare Other

## 2020-02-02 ENCOUNTER — Ambulatory Visit: Payer: Medicare Other

## 2020-02-09 IMAGING — MG DIGITAL SCREENING BILAT W/ TOMO
8 series · 9 of 24 positions shown · non-contrast
Comparison: Previous exam(s).

CLINICAL DATA: Screening.

EXAM:
DIGITAL SCREENING BILATERAL MAMMOGRAM WITH TOMO AND CAD

[L MLO synth-2D]
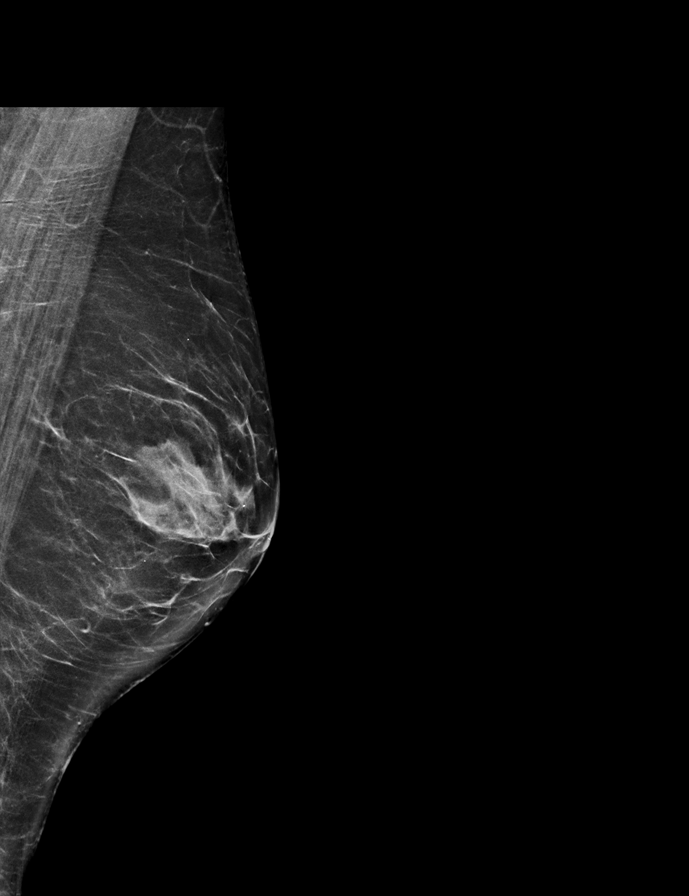

[R MLO synth-2D]
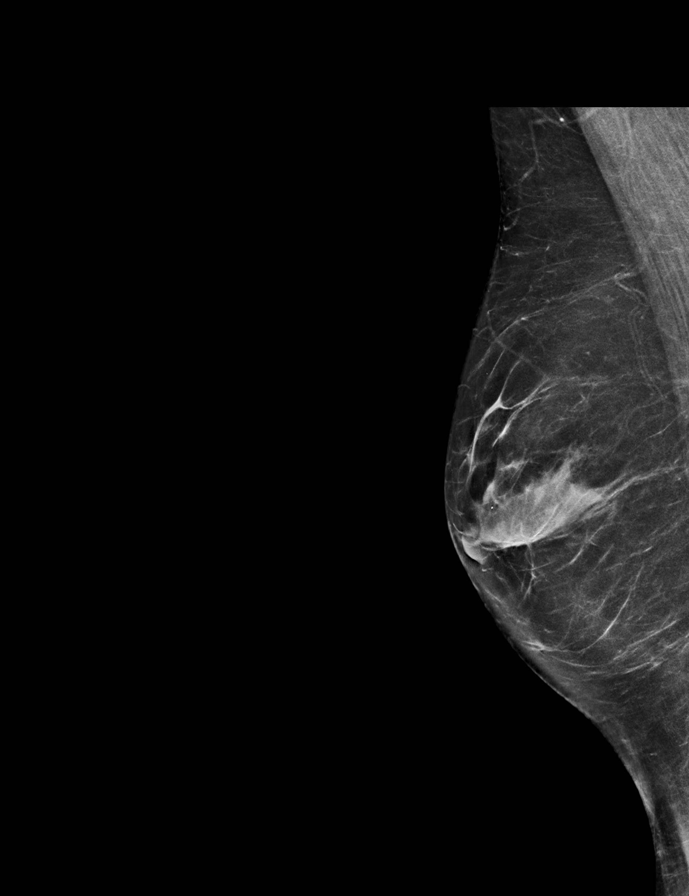

[L CC synth-2D]
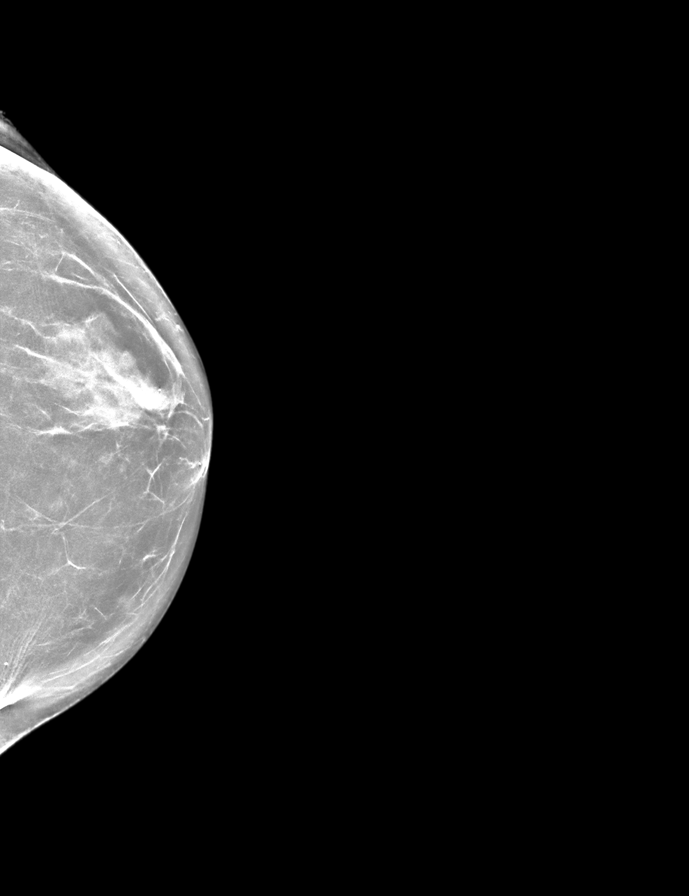

[R CC synth-2D]
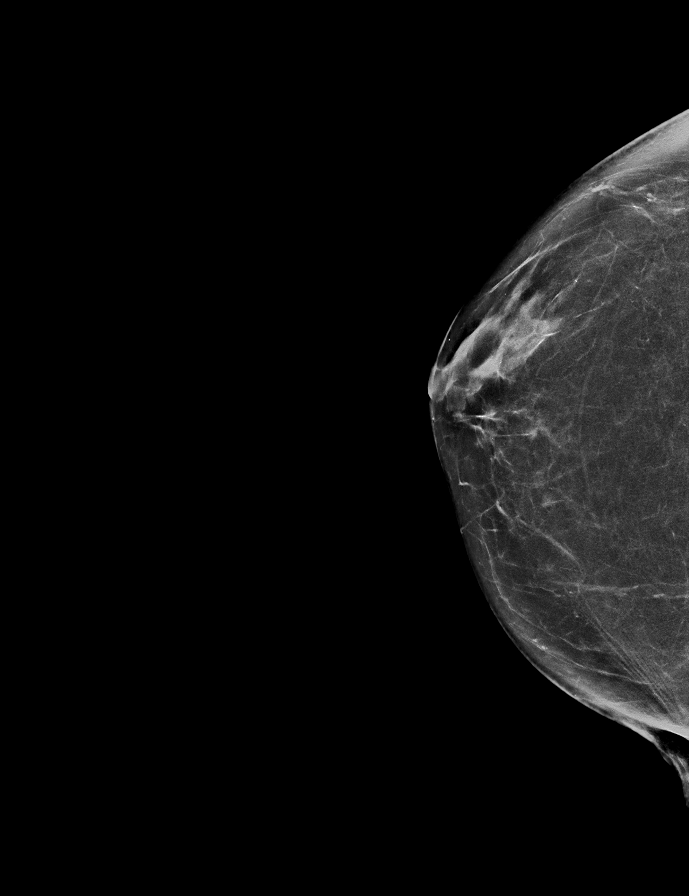

[L CC tomo · 2 of 67 frames shown]
[frame 22/67]
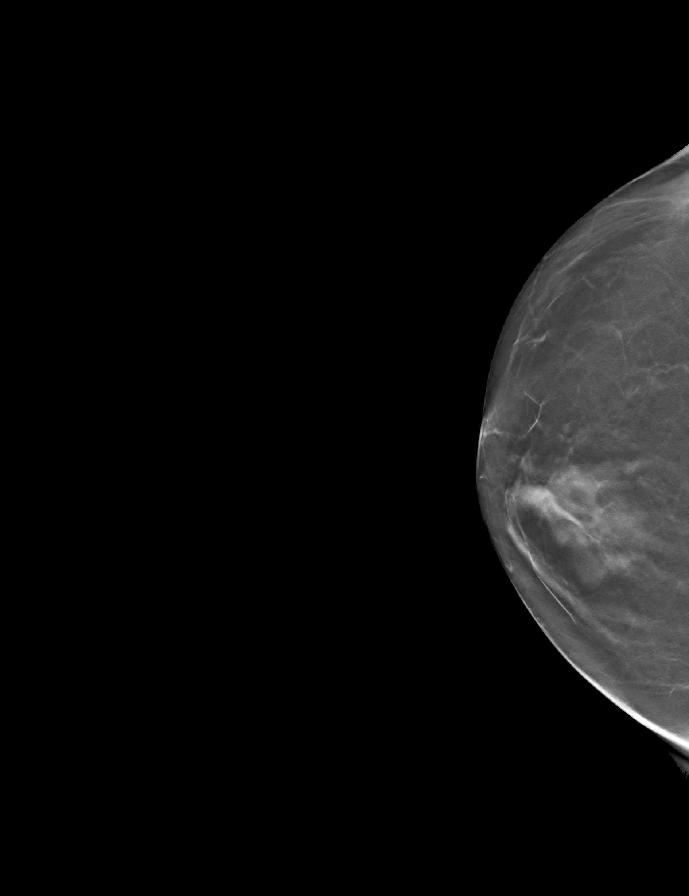
[frame 34/67]
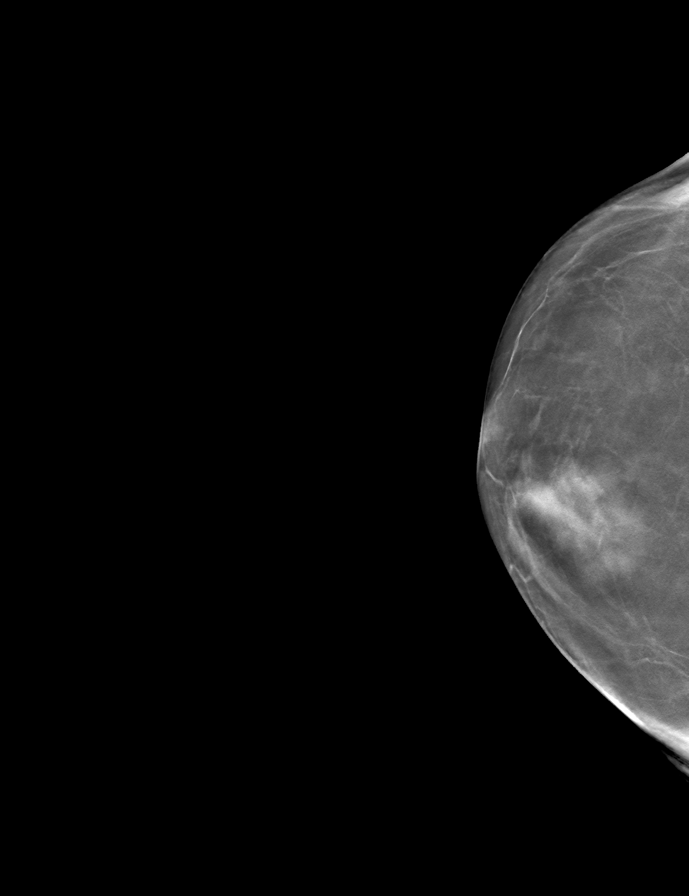

[R CC tomo · tomo slice 31/60.0]
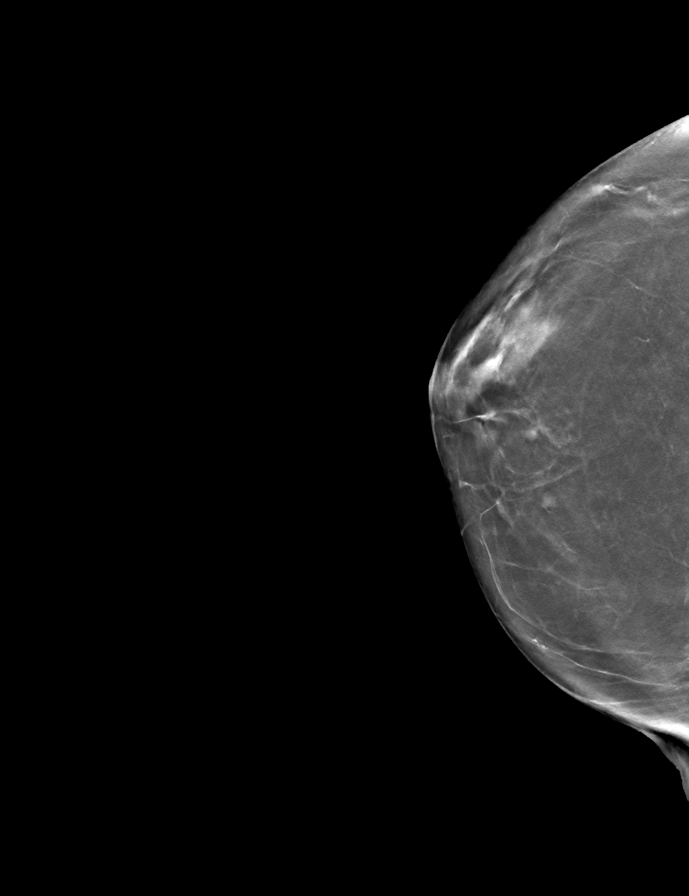

[L MLO tomo · tomo slice 33/65.0]
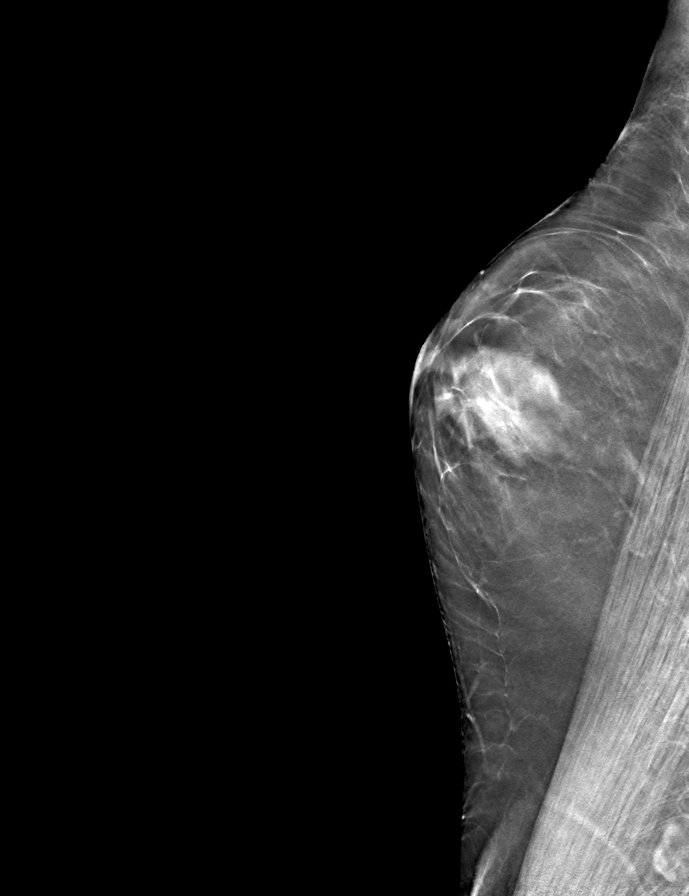

[R MLO tomo · tomo slice 33/64.0]
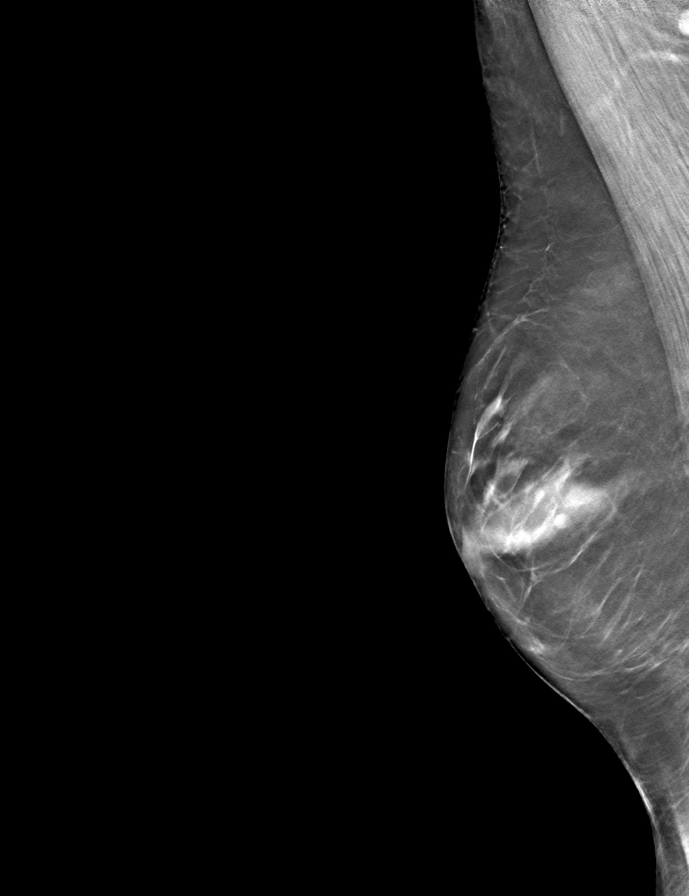

[9 of 24 positions shown; findings below may reference images not displayed]

ACR Breast Density Category b: There are scattered areas of
fibroglandular density.
FINDINGS: There are no findings suspicious for malignancy. Images were
processed with CAD.
IMPRESSION: No mammographic evidence of malignancy. A result letter of this
screening mammogram will be mailed directly to the patient.

RECOMMENDATION:
Screening mammogram in one year. (Code:CN-U-775)

BI-RADS CATEGORY  1: Negative.

## 2020-04-27 ENCOUNTER — Ambulatory Visit (INDEPENDENT_AMBULATORY_CARE_PROVIDER_SITE_OTHER): Payer: Medicare PPO

## 2020-04-27 ENCOUNTER — Other Ambulatory Visit: Payer: Self-pay

## 2020-04-27 ENCOUNTER — Encounter (INDEPENDENT_AMBULATORY_CARE_PROVIDER_SITE_OTHER): Payer: Self-pay | Admitting: Nurse Practitioner

## 2020-04-27 ENCOUNTER — Ambulatory Visit (INDEPENDENT_AMBULATORY_CARE_PROVIDER_SITE_OTHER): Payer: Medicare PPO | Admitting: Nurse Practitioner

## 2020-04-27 VITALS — BP 132/79 | HR 50 | Resp 16 | Wt 113.0 lb

## 2020-04-27 DIAGNOSIS — I701 Atherosclerosis of renal artery: Secondary | ICD-10-CM | POA: Diagnosis not present

## 2020-04-27 DIAGNOSIS — E782 Mixed hyperlipidemia: Secondary | ICD-10-CM

## 2020-04-27 DIAGNOSIS — I739 Peripheral vascular disease, unspecified: Secondary | ICD-10-CM | POA: Diagnosis not present

## 2020-04-27 DIAGNOSIS — I10 Essential (primary) hypertension: Secondary | ICD-10-CM | POA: Diagnosis not present

## 2020-04-27 NOTE — Progress Notes (Signed)
Subjective:    Patient ID: Morgan Ryan, female    DOB: 01-16-1951, 69 y.o.   MRN: [id number] Chief Complaint  Patient presents with  . Follow-up    ultrasound follow up    The patient returns the office 6 months after her most recent follow-up for her renal artery stenosis.  The patient has a previous history of bilateral renal artery stents in 2010.  The patient admits that she had went over 12 years without having her renal arteries evaluated until last year.  She denies any issues with her blood pressure control.  She denies any major changes in medications.  She denies any headaches, flushing or any unusual flank or back pain.  She denies any significant changes to her health care at this time.  The patient had her lab work done in March which shows that her GFR was within normal limits.  The patient remains very active daily.  She notes that she walks about 5 miles a day and denies any claudication-like symptoms or rest pain like symptoms.  She denies any angina or shortness of breath.  She denies any DVT or PE or superficial thrombophlebitis.   Today noninvasive studies show abnormal size for the right kidney.  There is also an abnormal right resistive index.  The right kidney is 7.74 cm compared to 9.20 cm in October 28, 2019.  There is a possible renal artery stent occlusion.  The proximal renal artery in the area of the stent is nearly obscured due to atherosclerosis as well as the velocities in this region could be from the abdominal aorta versus the renal artery.  Velocities within the mid to distal renal artery are significantly lower than the proximal portion which suggest possible stent occlusion.  There is also very low flow and low resistive index within the arcuate arteries which also suggest significant renal disease as well.  The left kidney also has an abnormal size.  There is also abnormal left resistive index.  The left kidney is 7.11 cm with previous studies showing  8.40 cm.  The proximal renal artery area of the stent is nearly obscure due to atherosclerosis and the velocities in this region could be from the abdominal aorta versus the renal artery.  As well the velocities in the mid to distal radial artery are significantly lower which could suggest stent occlusion.  There is very low flow and low resistive index within the arcuate arteries that just possible significant renal disease as well.   Review of Systems     Objective:   Physical Exam  BP 132/79 (BP Location: Left Arm)   Pulse (!) 50   Resp 16   Wt 113 lb (51.3 kg)   BMI 19.70 kg/m   Past Medical History:  Diagnosis Date  . Alopecia   . Chronic kidney disease    history nephrolithiasis  . Coronary artery disease   . Hyperparathyroidism ([place])    total calcium highest 12.2in 2/05, PTH 67. b. Sestamibi 6/04 revealed no adenoma osteoporosis with t score of -2.67 in femoral neck and   . Hypertension   . Renal artery stenosis [facility])     Social History   Socioeconomic History  . Marital status: Divorced    Spouse name: Not on file  . Number of children: Not on file  . Years of education: Not on file  . Highest education level: Not on file  Occupational History  . Not on file  Tobacco Use  .  Smoking status: Never Smoker  . Smokeless tobacco: Never Used  Substance and Sexual Activity  . Alcohol use: Never  . Drug use: Never  . Sexual activity: Not on file  Other Topics Concern  . Not on file  Social History Narrative  . Not on file   Social Determinants of Health   Financial Resource Strain:   . Difficulty of Paying Living Expenses:   Food Insecurity:   . Worried About Charity fundraiser in the Last Year:   . Arboriculturist in the Last Year:   Transportation Needs:   . Film/video editor (Medical):   Marland Kitchen Lack of Transportation (Non-Medical):   Physical Activity:   . Days of Exercise per Week:   . Minutes of Exercise per Session:   Stress:   . Feeling of Stress  :   Social Connections:   . Frequency of Communication with Friends and Family:   . Frequency of Social Gatherings with Friends and Family:   . Attends Religious Services:   . Active Member of Clubs or Organizations:   . Attends Archivist Meetings:   Marland Kitchen Marital Status:   Intimate Partner Violence:   . Fear of Current or Ex-Partner:   . Emotionally Abused:   Marland Kitchen Physically Abused:   . Sexually Abused:     Past Surgical History:  Procedure Laterality Date  . APPENDECTOMY    . CORONARY ARTERY BYPASS GRAFT    . ESOPHAGOGASTRODUODENOSCOPY (EGD) WITH PROPOFOL N/A 03/07/2018   Procedure: ESOPHAGOGASTRODUODENOSCOPY (EGD) WITH PROPOFOL;  Surgeon: Toledo, Benay Pike, MD;  Location: [facility] ENDOSCOPY;  Service: Gastroenterology;  Laterality: N/A;  . PARATHYROIDECTOMY     particial   . TONSILLECTOMY      Family History  Problem Relation Age of Onset  . Heart disease Mother   . Hypertension Father   . Hyperlipidemia Father   . Cancer Father   . Diabetes Father   . Heart attack Father   . Heart disease Paternal Grandmother     Allergies  Allergen Reactions  . Crestor [Rosuvastatin Calcium]   . Naproxen        Assessment & Plan:   1. Renal artery stenosis (HCC) Today patient's noninvasive studies are concerning for decreasing kidney size in addition to the fact that there is indication that the right renal artery stent is possibly occluded with possible significant narrowing of the left renal artery stent.   The patient's BP and noninvasive studies suggest the possibility of a hemodynamically significant stricture or stenosis.  Angiography was discussed with the patient, the risks and benefits were reviewed and all questions were answered.  The patient has agreed to proceed with angiography and the intention of intervention.     Arrangements will be made to treat the right renal artery with possible images of the left renal artery being obtained if permitted  The patient  will continue the current medications, no changes at this time.  The primary medical service will continue aggressive antihypertensive therapy as per the AHA guidelines     2. Mixed hyperlipidemia Continue statin as ordered and reviewed, no changes at this time   3. HTN (hypertension), benign Continue antihypertensive medications as already ordered, these medications have been reviewed and there are no changes at this time.   4. PAD (peripheral artery disease) (HCC)  Recommend:  The patient has evidence of atherosclerosis however with no signs symptoms of claudication.  The patient does not voice lifestyle limiting changes at this  point in time.   No invasive studies, angiography or surgery at this time The patient should continue walking and begin a more formal exercise program.  The patient should continue antiplatelet therapy and aggressive treatment of the lipid abnormalities  No changes in the patient's medications at this time  The patient should continue wearing graduated compression socks 10-15 mmHg strength to control the mild edema.     Current Outpatient Medications on File Prior to Visit  Medication Sig Dispense Refill  . atorvastatin (LIPITOR) 40 MG tablet Take 40 mg by mouth daily.    . Biotin 1000 MCG CHEW Chew by mouth.    . clopidogrel (PLAVIX) 75 MG tablet Take 75 mg by mouth daily.    Marland Kitchen ezetimibe (ZETIA) 10 MG tablet     . propranolol (INDERAL) 20 MG tablet Take 20 mg by mouth 2 (two) times daily.    . raloxifene (EVISTA) 60 MG tablet Take 60 mg by mouth daily.     No current facility-administered medications on file prior to visit.    There are no Patient Instructions on file for this visit. No follow-ups on file.   Kris Hartmann, NP

## 2020-05-08 ENCOUNTER — Other Ambulatory Visit (INDEPENDENT_AMBULATORY_CARE_PROVIDER_SITE_OTHER): Payer: Self-pay | Admitting: Nurse Practitioner

## 2020-05-08 ENCOUNTER — Other Ambulatory Visit: Payer: Self-pay

## 2020-05-08 ENCOUNTER — Telehealth (INDEPENDENT_AMBULATORY_CARE_PROVIDER_SITE_OTHER): Payer: Self-pay | Admitting: Vascular Surgery

## 2020-05-08 ENCOUNTER — Other Ambulatory Visit
Admission: RE | Admit: 2020-05-08 | Discharge: 2020-05-08 | Disposition: A | Discharge status: Home / Self Care | Payer: Medicare PPO | Source: Ambulatory Visit | Attending: Vascular Surgery | Admitting: Vascular Surgery

## 2020-05-08 DIAGNOSIS — Z20822 Contact with and (suspected) exposure to covid-19: Secondary | ICD-10-CM | POA: Diagnosis not present

## 2020-05-08 DIAGNOSIS — Z01812 Encounter for preprocedural laboratory examination: Secondary | ICD-10-CM | POA: Diagnosis present

## 2020-05-08 LAB — SARS CORONAVIRUS 2 (TAT 6-24 HRS): SARS Coronavirus 2: NEGATIVE

## 2020-05-08 NOTE — Telephone Encounter (Addendum)
Called stating that she hasnt received the packet that is usually sent for procedures. Mickel Baas is out of clinic today) She also wanted to know if she needed to continue to take the Plavix, NP Arna Medici advised that she should.  This note is for documentation purposes only.

## 2020-05-11 ENCOUNTER — Other Ambulatory Visit (INDEPENDENT_AMBULATORY_CARE_PROVIDER_SITE_OTHER): Payer: Self-pay | Admitting: Nurse Practitioner

## 2020-05-12 ENCOUNTER — Other Ambulatory Visit: Payer: Self-pay

## 2020-05-12 ENCOUNTER — Ambulatory Visit
Admission: RE | Admit: 2020-05-12 | Discharge: 2020-05-12 | Disposition: A | Discharge status: Home / Self Care | Payer: Medicare PPO | Attending: Vascular Surgery | Admitting: Vascular Surgery

## 2020-05-12 ENCOUNTER — Encounter: Payer: Self-pay | Admitting: Vascular Surgery

## 2020-05-12 ENCOUNTER — Encounter
Admission: RE | Disposition: A | Discharge status: Home / Self Care | Payer: Self-pay | Source: Home / Self Care | Attending: Vascular Surgery

## 2020-05-12 DIAGNOSIS — I2510 Atherosclerotic heart disease of native coronary artery without angina pectoris: Secondary | ICD-10-CM | POA: Diagnosis not present

## 2020-05-12 DIAGNOSIS — I150 Renovascular hypertension: Secondary | ICD-10-CM | POA: Insufficient documentation

## 2020-05-12 DIAGNOSIS — N189 Chronic kidney disease, unspecified: Secondary | ICD-10-CM | POA: Diagnosis not present

## 2020-05-12 DIAGNOSIS — E213 Hyperparathyroidism, unspecified: Secondary | ICD-10-CM | POA: Diagnosis not present

## 2020-05-12 DIAGNOSIS — I739 Peripheral vascular disease, unspecified: Secondary | ICD-10-CM | POA: Diagnosis not present

## 2020-05-12 DIAGNOSIS — I701 Atherosclerosis of renal artery: Secondary | ICD-10-CM | POA: Insufficient documentation

## 2020-05-12 DIAGNOSIS — Z7902 Long term (current) use of antithrombotics/antiplatelets: Secondary | ICD-10-CM | POA: Insufficient documentation

## 2020-05-12 DIAGNOSIS — E782 Mixed hyperlipidemia: Secondary | ICD-10-CM | POA: Diagnosis not present

## 2020-05-12 DIAGNOSIS — Z79899 Other long term (current) drug therapy: Secondary | ICD-10-CM | POA: Diagnosis not present

## 2020-05-12 DIAGNOSIS — L659 Nonscarring hair loss, unspecified: Secondary | ICD-10-CM | POA: Insufficient documentation

## 2020-05-12 HISTORY — PX: RENAL ANGIOGRAPHY: CATH118260

## 2020-05-12 LAB — CREATININE, SERUM
Creatinine, Ser: 0.93 mg/dL (ref 0.44–1.00)
GFR calc Af Amer: >60 mL/min (ref >60)
GFR calc non Af Amer: >60 mL/min (ref >60)

## 2020-05-12 LAB — BUN: BUN: 19 mg/dL (ref 8–23)

## 2020-05-12 SURGERY — RENAL ANGIOGRAPHY
Anesthesia: Moderate Sedation | Laterality: Right

## 2020-05-12 MED ORDER — ONDANSETRON HCL 4 MG/2ML IJ SOLN: 4.0000 mg | Freq: Four times a day (QID) | INTRAMUSCULAR | Status: DC | PRN

## 2020-05-12 MED ORDER — CEFAZOLIN SODIUM-DEXTROSE 2-4 GM/100ML-% IV SOLN
INTRAVENOUS | Status: AC
  Administered 2020-05-12: 2 g via INTRAVENOUS
  Filled 2020-05-12: qty 100

## 2020-05-12 MED ORDER — DIPHENHYDRAMINE HCL 50 MG/ML IJ SOLN: 50.0000 mg | Freq: Once | INTRAMUSCULAR | Status: DC | PRN

## 2020-05-12 MED ORDER — CEFAZOLIN SODIUM-DEXTROSE 2-4 GM/100ML-% IV SOLN: 2.0000 g | Freq: Once | INTRAVENOUS | Status: AC

## 2020-05-12 MED ORDER — MIDAZOLAM HCL 5 MG/5ML IJ SOLN
INTRAMUSCULAR | Status: AC
  Filled 2020-05-12: qty 5

## 2020-05-12 MED ORDER — MIDAZOLAM HCL 2 MG/2ML IJ SOLN
INTRAMUSCULAR | Status: DC | PRN
  Administered 2020-05-12 (×2): 1 mg via INTRAVENOUS
  Administered 2020-05-12: 0.5 mg via INTRAVENOUS

## 2020-05-12 MED ORDER — ACETAMINOPHEN 325 MG PO TABS
650.0000 mg | ORAL_TABLET | Freq: Once | ORAL | Status: AC
  Administered 2020-05-12: 650 mg via ORAL

## 2020-05-12 MED ORDER — SODIUM CHLORIDE 0.9 % IV SOLN: INTRAVENOUS | Status: DC

## 2020-05-12 MED ORDER — METHYLPREDNISOLONE SODIUM SUCC 125 MG IJ SOLR: 125.0000 mg | Freq: Once | INTRAMUSCULAR | Status: DC | PRN

## 2020-05-12 MED ORDER — FENTANYL CITRATE (PF) 100 MCG/2ML IJ SOLN
INTRAMUSCULAR | Status: AC
  Filled 2020-05-12: qty 2

## 2020-05-12 MED ORDER — HEPARIN SODIUM (PORCINE) 1000 UNIT/ML IJ SOLN
INTRAMUSCULAR | Status: DC | PRN
  Administered 2020-05-12: 4000 [IU] via INTRAVENOUS

## 2020-05-12 MED ORDER — HEPARIN SODIUM (PORCINE) 1000 UNIT/ML IJ SOLN
INTRAMUSCULAR | Status: AC
  Filled 2020-05-12: qty 1

## 2020-05-12 MED ORDER — MIDAZOLAM HCL 2 MG/ML PO SYRP: 8.0000 mg | ORAL_SOLUTION | Freq: Once | ORAL | Status: DC | PRN

## 2020-05-12 MED ORDER — MORPHINE SULFATE (PF) 4 MG/ML IV SOLN: 2.0000 mg | INTRAVENOUS | Status: DC | PRN

## 2020-05-12 MED ORDER — ACETAMINOPHEN 325 MG PO TABS
ORAL_TABLET | ORAL | Status: AC
  Filled 2020-05-12: qty 2

## 2020-05-12 MED ORDER — FAMOTIDINE 20 MG PO TABS: 40.0000 mg | ORAL_TABLET | Freq: Once | ORAL | Status: DC | PRN

## 2020-05-12 MED ORDER — FENTANYL CITRATE (PF) 100 MCG/2ML IJ SOLN
INTRAMUSCULAR | Status: DC | PRN
  Administered 2020-05-12: 25 ug via INTRAVENOUS
  Administered 2020-05-12: 50 ug via INTRAVENOUS

## 2020-05-12 MED ORDER — HYDROMORPHONE HCL 1 MG/ML IJ SOLN: 1.0000 mg | Freq: Once | INTRAMUSCULAR | Status: DC | PRN

## 2020-05-12 SURGICAL SUPPLY — 21 items
BALLN DORADO 6X20X80 (BALLOONS) ×3
BALLOON DORADO 6X20X80 (BALLOONS) IMPLANT
CATH ANGIO 5F PIGTAIL 65CM (CATHETERS) ×2 IMPLANT
CATH VS15FR (CATHETERS) ×2 IMPLANT
DEVICE PRESTO INFLATION (MISCELLANEOUS) ×2 IMPLANT
DEVICE SAFEGUARD 24CM (GAUZE/BANDAGES/DRESSINGS) ×2 IMPLANT
DEVICE STARCLOSE SE CLOSURE (Vascular Products) ×2 IMPLANT
DEVICE TORQUE .025-.038 (MISCELLANEOUS) ×2 IMPLANT
GLIDEWIRE STIFF .35X180X3 HYDR (WIRE) ×2 IMPLANT
NDL ENTRY 21GA 7CM ECHOTIP (NEEDLE) IMPLANT
NEEDLE ENTRY 21GA 7CM ECHOTIP (NEEDLE) ×3 IMPLANT
PACK ANGIOGRAPHY (CUSTOM PROCEDURE TRAY) ×2 IMPLANT
SET INTRO CAPELLA COAXIAL (SET/KITS/TRAYS/PACK) ×2 IMPLANT
SHEATH BRITE TIP 5FRX11 (SHEATH) ×2 IMPLANT
SHEATH DESTIN RDC 6FR 45 (SHEATH) ×2 IMPLANT
STENT LIFESTREAM 6X16X80 (Permanent Stent) ×2 IMPLANT
TUBING CONTRAST HIGH PRESS 72 (TUBING) ×2 IMPLANT
WIRE DOC EXTENSION .014X145CM (WIRE) IMPLANT
WIRE HITORQ VERSACORE ST 145CM (WIRE) ×2 IMPLANT
WIRE J 3MM .035X145CM (WIRE) ×2 IMPLANT
WIRE MAGIC TORQUE 315CM (WIRE) ×2 IMPLANT

## 2020-05-12 NOTE — Progress Notes (Signed)
Pt. Ate sandwich with HOB 45 degrees. After eating, noted pt. Bled underneath PAD. Put pt. In supine position again.PAD removed. Manual pressure held x 35 min. Vascular lab phoned x 2. Epinephrine 1% injected into RFA by D. Ernestina Patches, RN. MD made aware of situation. Pt. To remain supine for 30 more min. Per MD.

## 2020-05-12 NOTE — H&P (Signed)
 [facility] History & Physical Update  The patient was interviewed and re-examined.  The patient's previous History and Physical has been reviewed and is unchanged.  There is no change in the plan of care. We plan to proceed with the scheduled procedure.  Hortencia Pilar, MD  05/12/2020, 8:31 AM

## 2020-05-12 NOTE — Op Note (Signed)
Morgan Ryan Percutaneous Study/Intervention Procedural Note    Surgeon(s): Nurse, children's: none  Pre-operative Diagnosis: Recurrent right renal artery stenosis, renovascular hypertension  Post-operative diagnosis: Same  Procedure(s) Performed: 1. Ultrasound guidance for vascular access right femoral artery 2. Catheter placement into right renal artery from right femoral approach 3. Aortogram and selective right renal angiogram  4. Percutaneous transluminal angioplasty and stent placement with a 6 mm diameter by 16 mm length lifestream stent 5. StarClose closure device right femoral artery  Contrast: 30 cc  EBL: 10 cc  Fluroro Time: 10.5 minutes  Moderate Conscious sedation time: Continuous ECG pulse oximetry and cardiopulmonary monitoring was performed by the interventional radiology nurse.  Parenteral Versed and fentanyl were administered.  Total sedation time was 45 minutes  Indications: The patient is a 69 y.o.female with worsening severe hypertension and recurrent right renal artery stenosis.  The patient has suboptimal blood pressure control despite multiple antihypertensives and a noninvasive study demonstrating hemodynamically significant right renal artery stenosis.  Given the clinical scenario and the noninvasive findings, angiogram is indicated for further evaluation of her renal artery and potential treatment. Risks and benefits are discussed and informed consent is obtained.  Procedure: The patient was identified and appropriate procedural time out was performed. The patient was then placed supine on the table and prepped and draped in the usual sterile fashion.Moderate conscious sedation was administered with a face to face encounter with the patient throughout the procedure with my supervision of the RN administering medicines and monitoring the patients  vital signs and mental status throughout from the start of the procedure until the patient was taken to the recovery room. Ultrasound was used to evaluate the right common femoral artery. It was patent . A digital ultrasound image was acquired. A micropuncture needle was used to access the right common femoral artery under direct ultrasound guidance and a permanent image was performed. Microwire followed by microsheath was then inserted.  A 0.035 J wire was advanced without resistance and a 5Fr sheath was placed. Pigtail catheter was placed into the aorta at the L1 level and an AP aortogram was performed. This demonstrated recurrent greater than 90% bilateral renal artery stenosis. The patient was then systemically heparinized with 4000 units of intravenous heparin. I used a VS 1 catheter to cannulate the right renal artery and selective imaging was performed. This confirmed a 90% of the right renal artery.  At this point I selected the Magic torque wire and crossed the lesion without difficulty parking the wire well into renal artery branches beyond the previously placed stent.   I then selected a 6 mm diameter x 16 mm length lifestream stent and brought this across the lesion within and just beyond the stent.  This was inflated encompassing the lesion with its proximal extent going back into the aorta and its distal end just beyond the previous stent.  This was inflated to 12 ATM but the waist did not resolved.  Therefore, a 6 mm x 20 mm [place] balloon was advanced across the lesion inflated to 16 atm for 30 seconds.  At this point the stent and stricture yielded.  Completion angiogram showed wide patency with good positioning of the stent there is less than 10% residual stenosis..   The guide sheath was removed. Oblique arteriogram was performed of the right femoral artery and StarClose closure device was deployed in the usual fashion with excellent hemostatic result. The patient was taken to the  recovery room in  stable condition having tolerated the procedure well.  Findings:  Aortogram/Renal Arteries:Initial aortogram verifies greater than 90% right renal artery stenosis.  Also noted is a greater than 90% left renal artery stenosis at the distal edge of the previously placed stents.  Selective imaging of the right renal artery confirms the greater than 90% stenosis at the distal edge of the existing stents.  Beyond this the renal artery is widely patent.  Normal nephrograms.  Following stent placement postdilatation with the [facility] balloon there is now less than 10% residual stenosis of the right renal artery.   Condition:  Stable  Complications: None   Morgan Ryan 05/12/2020 9:59 AM   This note was created with Dragon Medical transcription system. Any errors in dictation are purely unintentional.

## 2020-05-12 NOTE — Progress Notes (Signed)
Dr. Delana Meyer at bedside, speaking with pt. And her son Hilliard Clark re: procedural results, activity restrictions, follow-up. Pt. Verbalized understanding of conversation.

## 2020-05-28 ENCOUNTER — Ambulatory Visit (INDEPENDENT_AMBULATORY_CARE_PROVIDER_SITE_OTHER): Payer: Medicare PPO | Admitting: Vascular Surgery

## 2020-05-28 ENCOUNTER — Encounter (INDEPENDENT_AMBULATORY_CARE_PROVIDER_SITE_OTHER): Payer: Self-pay | Admitting: Vascular Surgery

## 2020-05-28 ENCOUNTER — Other Ambulatory Visit: Payer: Self-pay

## 2020-05-28 VITALS — BP 133/84 | HR 74 | Resp 16 | Wt 110.6 lb

## 2020-05-28 DIAGNOSIS — I25708 Atherosclerosis of coronary artery bypass graft(s), unspecified, with other forms of angina pectoris: Secondary | ICD-10-CM | POA: Diagnosis not present

## 2020-05-28 DIAGNOSIS — E782 Mixed hyperlipidemia: Secondary | ICD-10-CM

## 2020-05-28 DIAGNOSIS — I701 Atherosclerosis of renal artery: Secondary | ICD-10-CM

## 2020-05-28 DIAGNOSIS — I10 Essential (primary) hypertension: Secondary | ICD-10-CM | POA: Diagnosis not present

## 2020-05-28 DIAGNOSIS — I739 Peripheral vascular disease, unspecified: Secondary | ICD-10-CM | POA: Diagnosis not present

## 2020-05-31 ENCOUNTER — Encounter (INDEPENDENT_AMBULATORY_CARE_PROVIDER_SITE_OTHER): Payer: Self-pay | Admitting: Vascular Surgery

## 2020-05-31 NOTE — Progress Notes (Signed)
MRN : [id number]  Morgan Ryan is a 69 y.o. (1951-01-04) female who presents with chief complaint of  Chief Complaint  Patient presents with  . Follow-up    [facility] 2wk post renal angiography  .  History of Present Illness:   The patient returns to the office for followup s/p repeat right renal artery stenting for recurrent renal artery stenosis. There have been no interval changes in the patient's blood pressure control.  She denies any major changes in her medications.  The patient denies headache or flushing.  No flank or unusual back pain.    There have been no significant changes to the patient's overall health care.  No interval shortening of the patient's walking distance or new symptoms consistent with claudication.  The patient denies the  development of rest pain symptoms. No new ulcers or wounds have occurred since the last visit.  The patient denies amaurosis fugax or recent TIA symptoms. There are no recent neurological changes noted. The patient denies history of DVT, PE or superficial thrombophlebitis. The patient denies recent episodes of angina or shortness of breath.    Current Meds  Medication Sig  . atorvastatin (LIPITOR) 40 MG tablet Take 40 mg by mouth at bedtime.   . Biotin 1000 MCG CHEW Chew 1,000 mg by mouth daily.   . clopidogrel (PLAVIX) 75 MG tablet Take 75 mg by mouth daily.  Marland Kitchen ezetimibe (ZETIA) 10 MG tablet Take 10 mg by mouth daily.   . Glycerin-Hypromellose-PEG 400 (DRY EYE RELIEF DROPS) 0.2-0.2-1 % SOLN Place 1 drop into both eyes daily as needed (Dry eye).  . propranolol (INDERAL) 20 MG tablet Take 20 mg by mouth 2 (two) times daily.  . raloxifene (EVISTA) 60 MG tablet Take 60 mg by mouth daily.    Past Medical History:  Diagnosis Date  . Alopecia   . Chronic kidney disease    history nephrolithiasis  . Coronary artery disease   . Hyperparathyroidism ([place])    total calcium highest 12.2in 2/05, PTH 67. b. Sestamibi 6/04 revealed no  adenoma osteoporosis with t score of -2.67 in femoral neck and   . Hypertension   . Renal artery stenosis [facility])     Past Surgical History:  Procedure Laterality Date  . APPENDECTOMY    . CORONARY ARTERY BYPASS GRAFT    . ESOPHAGOGASTRODUODENOSCOPY (EGD) WITH PROPOFOL N/A 03/07/2018   Procedure: ESOPHAGOGASTRODUODENOSCOPY (EGD) WITH PROPOFOL;  Surgeon: Toledo, Benay Pike, MD;  Location: [facility] ENDOSCOPY;  Service: Gastroenterology;  Laterality: N/A;  . PARATHYROIDECTOMY     particial   . RENAL ANGIOGRAPHY Right 05/12/2020   Procedure: RENAL ANGIOGRAPHY;  Surgeon: Katha Cabal, MD;  Location: [place] [facility];  Service: Cardiovascular;  Laterality: Right;  . STENT PLACEMENT VASCULAR ([place] [facility])    . TONSILLECTOMY      Social History Social History   Tobacco Use  . Smoking status: Never Smoker  . Smokeless tobacco: Never Used  Vaping Use  . Vaping Use: Never used  Substance Use Topics  . Alcohol use: Never  . Drug use: Never    Family History Family History  Problem Relation Age of Onset  . Heart disease Mother   . Hypertension Father   . Hyperlipidemia Father   . Cancer Father   . Diabetes Father   . Heart attack Father   . Heart disease Paternal Grandmother     Allergies  Allergen Reactions  . Crestor [Rosuvastatin Calcium] Other (See Comments)  Body pain  . Naproxen Nausea And Vomiting     REVIEW OF SYSTEMS (Negative unless checked)  Constitutional: [] Weight loss  [] Fever  [] Chills Cardiac: [] Chest pain   [] Chest pressure   [] Palpitations   [] Shortness of breath when laying flat   [] Shortness of breath with exertion. Vascular:  [x] Pain in legs with walking   [] Pain in legs at rest  [] History of DVT   [] Phlebitis   [] Swelling in legs   [] Varicose veins   [] Non-healing ulcers Pulmonary:   [] Uses home oxygen   [] Productive cough   [] Hemoptysis   [] Wheeze  [] COPD   [] Asthma Neurologic:  [] Dizziness   [] Seizures   [] History of stroke   [] History of TIA   [] Aphasia   [] Vissual changes   [] Weakness or numbness in arm   [] Weakness or numbness in leg Musculoskeletal:   [] Joint swelling   [] Joint pain   [] Low back pain Hematologic:  [] Easy bruising  [] Easy bleeding   [] Hypercoagulable state   [] Anemic Gastrointestinal:  [] Diarrhea   [] Vomiting  [] Gastroesophageal reflux/heartburn   [] Difficulty swallowing. Genitourinary:  [] Chronic kidney disease   [] Difficult urination  [] Frequent urination   [] Blood in urine Skin:  [] Rashes   [] Ulcers  Psychological:  [] History of anxiety   []  History of major depression.  Physical Examination  Vitals:   05/28/20 1539  BP: 133/84  Pulse: 74  Resp: 16  Weight: 110 lb 9.6 oz (50.2 kg)   Body mass index is 19.59 kg/m. Gen: WD/WN, NAD Head: [place]/AT, No temporalis wasting.  Ear/Nose/Throat: Hearing grossly intact, nares w/o erythema or drainage Eyes: PER, EOMI, sclera nonicteric.  Neck: Supple, no large masses.   Pulmonary:  Good air movement, no audible wheezing bilaterally, no use of accessory muscles.  Cardiac: RRR, no JVD Vascular:  Vessel Right Left  Radial Palpable Palpable  Gastrointestinal: Non-distended. No guarding/no peritoneal signs.  Musculoskeletal: M/S 5/5 throughout.  No deformity or atrophy.  Neurologic: CN 2-12 intact. Symmetrical.  Speech is fluent. Motor exam as listed above. Psychiatric: Judgment intact, Mood & affect appropriate for pt's clinical situation. Dermatologic: No rashes or ulcers noted.  No changes consistent with cellulitis.   CBC No results found for: WBC, HGB, HCT, MCV, PLT  BMET    Component Value Date/Time   BUN 19 05/12/2020 0713   CREATININE 0.93 05/12/2020 0713   GFRNONAA >60 05/12/2020 0713   GFRAA >60 05/12/2020 0713   Estimated Creatinine Clearance: 46.5 mL/min (by C-G formula based on SCr of 0.93 mg/dL).  COAG No results found for: INR, PROTIME  Radiology PERIPHERAL VASCULAR CATHETERIZATION  Result Date: 05/12/2020 See op  note    Assessment/Plan 1. Renal artery stenosis (HCC) BP today was not acceptable with systolic reading > 503 and diastolic reading >54 while taking 3 medications.  Given that optimal control of the patient's hypertension is important to minimize the risk of heart attack and/or CVA.  The patient's BP and noninvasive studies suggest the possibility of a hemodynamically significant stricture or stenosis.  Angiography was discussed with the patient, the risks and benefits were reviewed and all questions were answered.  The patient has agreed to proceed with angiography and the intention of intervention.     Arrangements will be made to treat the left renal artery.  The patient will continue the current medications, no changes at this time.  The primary medical service will continue aggressive antihypertensive therapy as per the AHA guidelines     2. PAD (peripheral artery disease) ([place])  Recommend:  The patient has evidence of atherosclerosis of the lower extremities with claudication.  The patient does not voice lifestyle limiting changes at this point in time.  Noninvasive studies do not suggest clinically significant change.  No invasive studies, angiography or surgery at this time The patient should continue walking and begin a more formal exercise program.  The patient should continue antiplatelet therapy and aggressive treatment of the lipid abnormalities  No changes in the patient's medications at this time  The patient should continue wearing graduated compression socks 10-15 mmHg strength to control the mild edema.    3. HTN (hypertension), benign Continue antihypertensive medications as already ordered, these medications have been reviewed and there are no changes at this time.   4. Coronary artery disease of bypass graft of native heart with stable angina pectoris ([place]) Continue cardiac and antihypertensive medications as already ordered and reviewed, no changes at  this time.  Continue statin as ordered and reviewed, no changes at this time  Nitrates PRN for chest pain   5. Mixed hyperlipidemia Continue statin as ordered and reviewed, no changes at this time     Hortencia Pilar, MD  05/31/2020 10:20 AM

## 2020-06-02 ENCOUNTER — Telehealth (INDEPENDENT_AMBULATORY_CARE_PROVIDER_SITE_OTHER): Payer: Self-pay

## 2020-06-02 NOTE — Telephone Encounter (Signed)
I attempted to contact and a message was left for a return call. Patient is scheduled for a left renal angiogram with Dr. Delana Meyer scheduled for 06/16/20 with a 8:00 am arrival time to the [facility]. Covid testing on 06/12/20 between 8-1 pm at the [place]. Pre-procedure instructions will be mailed. Patient requested to be scheduled after 06/14/20.

## 2020-06-09 ENCOUNTER — Other Ambulatory Visit (INDEPENDENT_AMBULATORY_CARE_PROVIDER_SITE_OTHER): Payer: Self-pay | Admitting: Nurse Practitioner

## 2020-06-12 ENCOUNTER — Other Ambulatory Visit
Admission: RE | Admit: 2020-06-12 | Discharge: 2020-06-12 | Disposition: A | Discharge status: Home / Self Care | Payer: Medicare PPO | Source: Ambulatory Visit | Attending: Vascular Surgery | Admitting: Vascular Surgery

## 2020-06-12 ENCOUNTER — Other Ambulatory Visit: Payer: Self-pay

## 2020-06-12 DIAGNOSIS — Z01812 Encounter for preprocedural laboratory examination: Secondary | ICD-10-CM | POA: Insufficient documentation

## 2020-06-12 DIAGNOSIS — Z20822 Contact with and (suspected) exposure to covid-19: Secondary | ICD-10-CM | POA: Insufficient documentation

## 2020-06-12 LAB — SARS CORONAVIRUS 2 (TAT 6-24 HRS): SARS Coronavirus 2: NEGATIVE

## 2020-06-16 ENCOUNTER — Ambulatory Visit
Admission: RE | Admit: 2020-06-16 | Discharge: 2020-06-16 | Disposition: A | Discharge status: Home / Self Care | Payer: Medicare PPO | Attending: Vascular Surgery | Admitting: Vascular Surgery

## 2020-06-16 ENCOUNTER — Encounter
Admission: RE | Disposition: A | Discharge status: Home / Self Care | Payer: Self-pay | Source: Home / Self Care | Attending: Vascular Surgery

## 2020-06-16 ENCOUNTER — Other Ambulatory Visit: Payer: Self-pay

## 2020-06-16 DIAGNOSIS — N189 Chronic kidney disease, unspecified: Secondary | ICD-10-CM | POA: Diagnosis not present

## 2020-06-16 DIAGNOSIS — I129 Hypertensive chronic kidney disease with stage 1 through stage 4 chronic kidney disease, or unspecified chronic kidney disease: Secondary | ICD-10-CM | POA: Diagnosis not present

## 2020-06-16 DIAGNOSIS — L659 Nonscarring hair loss, unspecified: Secondary | ICD-10-CM | POA: Diagnosis not present

## 2020-06-16 DIAGNOSIS — Z888 Allergy status to other drugs, medicaments and biological substances status: Secondary | ICD-10-CM | POA: Diagnosis not present

## 2020-06-16 DIAGNOSIS — I25708 Atherosclerosis of coronary artery bypass graft(s), unspecified, with other forms of angina pectoris: Secondary | ICD-10-CM | POA: Diagnosis not present

## 2020-06-16 DIAGNOSIS — E782 Mixed hyperlipidemia: Secondary | ICD-10-CM | POA: Insufficient documentation

## 2020-06-16 DIAGNOSIS — Z886 Allergy status to analgesic agent status: Secondary | ICD-10-CM | POA: Insufficient documentation

## 2020-06-16 DIAGNOSIS — I701 Atherosclerosis of renal artery: Secondary | ICD-10-CM

## 2020-06-16 DIAGNOSIS — I150 Renovascular hypertension: Secondary | ICD-10-CM | POA: Diagnosis not present

## 2020-06-16 DIAGNOSIS — Z951 Presence of aortocoronary bypass graft: Secondary | ICD-10-CM | POA: Insufficient documentation

## 2020-06-16 DIAGNOSIS — Z95828 Presence of other vascular implants and grafts: Secondary | ICD-10-CM | POA: Diagnosis not present

## 2020-06-16 DIAGNOSIS — Z7902 Long term (current) use of antithrombotics/antiplatelets: Secondary | ICD-10-CM | POA: Insufficient documentation

## 2020-06-16 DIAGNOSIS — Z79899 Other long term (current) drug therapy: Secondary | ICD-10-CM | POA: Insufficient documentation

## 2020-06-16 DIAGNOSIS — I70213 Atherosclerosis of native arteries of extremities with intermittent claudication, bilateral legs: Secondary | ICD-10-CM | POA: Insufficient documentation

## 2020-06-16 DIAGNOSIS — E890 Postprocedural hypothyroidism: Secondary | ICD-10-CM | POA: Insufficient documentation

## 2020-06-16 HISTORY — PX: RENAL ANGIOGRAPHY: CATH118260

## 2020-06-16 LAB — CREATININE, SERUM
Creatinine, Ser: 1.01 mg/dL — ABNORMAL HIGH (ref 0.44–1.00)
GFR calc Af Amer: >60 mL/min (ref >60)
GFR calc non Af Amer: 58 mL/min — ABNORMAL LOW (ref >60)

## 2020-06-16 LAB — BUN: BUN: 21 mg/dL (ref 8–23)

## 2020-06-16 SURGERY — RENAL ANGIOGRAPHY
Anesthesia: Moderate Sedation | Laterality: Left

## 2020-06-16 MED ORDER — MORPHINE SULFATE (PF) 4 MG/ML IV SOLN: 2.0000 mg | INTRAVENOUS | Status: DC | PRN

## 2020-06-16 MED ORDER — HEPARIN SODIUM (PORCINE) 1000 UNIT/ML IJ SOLN
INTRAMUSCULAR | Status: DC | PRN
  Administered 2020-06-16: 4000 [IU] via INTRAVENOUS

## 2020-06-16 MED ORDER — FENTANYL CITRATE (PF) 100 MCG/2ML IJ SOLN
INTRAMUSCULAR | Status: DC | PRN
  Administered 2020-06-16: 50 ug via INTRAVENOUS

## 2020-06-16 MED ORDER — MIDAZOLAM HCL 2 MG/ML PO SYRP: 8.0000 mg | ORAL_SOLUTION | Freq: Once | ORAL | Status: DC | PRN

## 2020-06-16 MED ORDER — FENTANYL CITRATE (PF) 100 MCG/2ML IJ SOLN
INTRAMUSCULAR | Status: AC
  Filled 2020-06-16: qty 2

## 2020-06-16 MED ORDER — METHYLPREDNISOLONE SODIUM SUCC 125 MG IJ SOLR: 125.0000 mg | Freq: Once | INTRAMUSCULAR | Status: DC | PRN

## 2020-06-16 MED ORDER — MIDAZOLAM HCL 2 MG/2ML IJ SOLN
INTRAMUSCULAR | Status: DC | PRN
  Administered 2020-06-16: 2 mg via INTRAVENOUS

## 2020-06-16 MED ORDER — HYDROMORPHONE HCL 1 MG/ML IJ SOLN: 1.0000 mg | Freq: Once | INTRAMUSCULAR | Status: DC | PRN

## 2020-06-16 MED ORDER — CEFAZOLIN SODIUM-DEXTROSE 2-4 GM/100ML-% IV SOLN
INTRAVENOUS | Status: AC
  Administered 2020-06-16: 2 g via INTRAVENOUS
  Filled 2020-06-16: qty 100

## 2020-06-16 MED ORDER — HEPARIN SODIUM (PORCINE) 1000 UNIT/ML IJ SOLN
INTRAMUSCULAR | Status: AC
  Filled 2020-06-16: qty 1

## 2020-06-16 MED ORDER — SODIUM CHLORIDE 0.9 % IV SOLN: INTRAVENOUS | Status: DC

## 2020-06-16 MED ORDER — CEFAZOLIN SODIUM-DEXTROSE 2-4 GM/100ML-% IV SOLN: 2.0000 g | Freq: Once | INTRAVENOUS | Status: AC

## 2020-06-16 MED ORDER — ONDANSETRON HCL 4 MG/2ML IJ SOLN: 4.0000 mg | Freq: Four times a day (QID) | INTRAMUSCULAR | Status: DC | PRN

## 2020-06-16 MED ORDER — FAMOTIDINE 20 MG PO TABS: 40.0000 mg | ORAL_TABLET | Freq: Once | ORAL | Status: DC | PRN

## 2020-06-16 MED ORDER — DIPHENHYDRAMINE HCL 50 MG/ML IJ SOLN: 50.0000 mg | Freq: Once | INTRAMUSCULAR | Status: DC | PRN

## 2020-06-16 MED ORDER — MIDAZOLAM HCL 5 MG/5ML IJ SOLN
INTRAMUSCULAR | Status: AC
  Filled 2020-06-16: qty 5

## 2020-06-16 SURGICAL SUPPLY — 20 items
BALLN STERLING OTW 5X20X135 (BALLOONS) ×3
BALLOON STERLING OTW 5X20X135 (BALLOONS) ×1 IMPLANT
CATH ANGIO 5F PIGTAIL 65CM (CATHETERS) ×3 IMPLANT
CATH BEACON 5 .035 65 C2 TIP (CATHETERS) ×3 IMPLANT
CATH VS15FR (CATHETERS) ×3 IMPLANT
DEVICE PRESTO INFLATION (MISCELLANEOUS) ×3 IMPLANT
DEVICE STARCLOSE SE CLOSURE (Vascular Products) ×3 IMPLANT
DEVICE TORQUE .025-.038 (MISCELLANEOUS) ×3 IMPLANT
GLIDEWIRE ADV .035X180CM (WIRE) ×3 IMPLANT
GLIDEWIRE STIFF .35X180X3 HYDR (WIRE) ×3 IMPLANT
NEEDLE ENTRY 21GA 7CM ECHOTIP (NEEDLE) ×3 IMPLANT
PACK ANGIOGRAPHY (CUSTOM PROCEDURE TRAY) ×3 IMPLANT
SET INTRO CAPELLA COAXIAL (SET/KITS/TRAYS/PACK) ×3 IMPLANT
SHEATH BRITE TIP 5FRX11 (SHEATH) ×3 IMPLANT
SHEATH FLEXOR ANSEL2 7FRX45 (SHEATH) ×3 IMPLANT
STENT VIABAHN 6X25X120 (Permanent Stent) ×3 IMPLANT
SYR MEDRAD MARK 7 150ML (SYRINGE) ×3 IMPLANT
TUBING CONTRAST HIGH PRESS 72 (TUBING) ×3 IMPLANT
WIRE G.018X260 SHT (WIRE) ×3 IMPLANT
WIRE J 3MM .035X145CM (WIRE) ×3 IMPLANT

## 2020-06-16 NOTE — H&P (Signed)
 [facility] History & Physical Update  The patient was interviewed and re-examined.  The patient's previous History and Physical has been reviewed and is unchanged.  There is no change in the plan of care. We plan to proceed with the scheduled procedure.  Hortencia Pilar, MD  06/16/2020, 9:51 AM

## 2020-06-16 NOTE — Op Note (Signed)
[facility] Percutaneous Study/Intervention Procedural Note    Surgeon(s): Nurse, children's: None  Pre-operative Diagnosis: Recurrent critical left renal artery stenosis, renovascular hypertension  Post-operative diagnosis: Same  Procedure(s) Performed: 1. Ultrasound guidance for vascular access right femoral artery 2. Catheter placement into left renal artery from right femoral approach 3. Aortogram and selective left renal angiogram 4. Placement of a 6 mm x 25 mm Viabahn stent left renal artery 5. StarClose closure device right femoral artery  Contrast: 25 cc  EBL: Less than 10 cc  Fluoro Time: 9.9 minutes  Moderate conscious sedation: Approximately 45 minutes with parenteral Versed and Fentanyl.  Continuous ECG pulse oximetry and cardiopulmonary monitoring was performed throughout the entire procedure by the interventional radiology nurse.  Indications: The patient is a 69 year old woman with worsening severe hypertension despite multiple antihypertensive medications. The patient has suboptimal blood pressure control despite multiple antihypertensives and a noninvasive study demonstrating hemodynamically significant recurrent left renal artery stenosis. Given the clinical scenario and the noninvasive findings, angiogram is indicated for further evaluation of her renal artery and potential treatment. Risks and benefits are discussed and informed consent is obtained.  Procedure: The patient was identified and appropriate procedural time out was performed. The patient was then placed supine on the table and prepped and draped in the usual sterile fashion.Moderate conscious sedation was administered with a face to face encounter with the patient throughout the procedure with my supervision of the RN administering medicines and monitoring the patients vital signs and  mental status throughout from the start of the procedure until the patient was taken to the recovery room  Ultrasound was used to evaluate the right common femoral artery. It was patent . A digital ultrasound image was acquired. A micropuncture kit was used to access the right common femoral artery under direct ultrasound guidance and a permanent image was performed. A 0.035 J wire was advanced without resistance and a 5Fr sheath was placed. Pigtail catheter was placed into the aorta at the L1 level and an AP aortogram was performed. This demonstrated greater than 90% stenosis at the distal margin of the previously placed left renal stent.  Proximal portion of the stent is well positioned and widely patent. The patient was then systemically heparinized with 4000 units of intravenous heparin. I used a VS 1 and then a C2 catheter to cannulate the left renal artery and selective imaging was performed. This confirmed a 90% stenosis of the left renal artery.  At this point I selected the 0.018 platinum plus wire and crossed the lesion without difficulty.   I then selected a 6 mm diameter x 25 mm length Viabahn stent and brought this across the lesion.  This was deployed encompassing the lesion with its proximal extent going back into the aorta for a mm or two.  A 5 mm x 20 mm Sterling balloon was then advanced across the stent.  This was inflated to a maximum of 10 atm ATM and the waist resolved.  Completion angiogram showed less than 5% residual stenosis with excellent positioning of the stent.   The sheath was removed from the renal artery. Oblique arteriogram was performed of the right femoral artery and StarClose closure device was deployed in the usual fashion with excellent hemostatic result. The patient was taken to the recovery room in stable condition having tolerated the procedure well.  Findings:  Aortogram/Renal Arteries:The abdominal aorta is opacified with a bolus injection  contrast.  The previously treated right renal  artery remains widely patent.  The left renal artery demonstrates a greater than 90% stenosis at the distal margin of the previously placed stent.  Proximally the stent is well positioned and widely patent.  Distally there is wide patency of the renal arterial system.  Following angioplasty and stent placement there is less than 5% residual stenosis within the left renal artery.   Condition:  Stable  Complications: None   Hortencia Pilar 06/16/2020 11:02 AM  This note was created with Dragon Medical transcription system. Any errors in dictation are purely unintentional.

## 2020-06-16 NOTE — Discharge Instructions (Signed)
Moderate Conscious Sedation, Adult, Care After These instructions provide you with information about caring for yourself after your procedure. Your health care provider may also give you more specific instructions. Your treatment has been planned according to current medical practices, but problems sometimes occur. Call your health care provider if you have any problems or questions after your procedure. What can I expect after the procedure? After your procedure, it is common:  To feel sleepy for several hours.  To feel clumsy and have poor balance for several hours.  To have poor judgment for several hours.  To vomit if you eat too soon. Follow these instructions at home: For at least 24 hours after the procedure:   Do not: ? Participate in activities where you could fall or become injured. ? Drive. ? Use heavy machinery. ? Drink alcohol. ? Take sleeping pills or medicines that cause drowsiness. ? Make important decisions or sign legal documents. ? Take care of children on your own.  Rest. Eating and drinking  Follow the diet recommended by your health care provider.  If you vomit: ? Drink water, juice, or soup when you can drink without vomiting. ? Make sure you have little or no nausea before eating solid foods. General instructions  Have a responsible adult stay with you until you are awake and alert.  Take over-the-counter and prescription medicines only as told by your health care provider.  If you smoke, do not smoke without supervision.  Keep all follow-up visits as told by your health care provider. This is important. Contact a health care provider if:  You keep feeling nauseous or you keep vomiting.  You feel light-headed.  You develop a rash.  You have a fever. Get help right away if:  You have trouble breathing. This information is not intended to replace advice given to you by your health care provider. Make sure you discuss any questions you have  with your health care provider. Document Revised: 11/10/2017 Document Reviewed: 03/19/2016 Elsevier Patient Education  2020 [employer]   Femoral Site Care This sheet gives you information about how to care for yourself after your procedure. Your health care provider may also give you more specific instructions. If you have problems or questions, contact your health care provider. What can I expect after the procedure? After the procedure, it is common to have:  Bruising that usually fades within 1-2 weeks.  Tenderness at the site. Follow these instructions at home: Wound care  Follow instructions from your health care provider about how to take care of your insertion site. Make sure you: ? Wash your hands with soap and water before you change your bandage (dressing). If soap and water are not available, use hand sanitizer. ? Change your dressing as told by your health care provider. ? Leave stitches (sutures), skin glue, or adhesive strips in place. These skin closures may need to stay in place for 2 weeks or longer. If adhesive strip edges start to loosen and curl up, you may trim the loose edges. Do not remove adhesive strips completely unless your health care provider tells you to do that.  Do not take baths, swim, or use a hot tub until your health care provider approves.  You may shower 24-48 hours after the procedure or as told by your health care provider. ? Gently wash the site with plain soap and water. ? Pat the area dry with a clean towel. ? Do not rub the site. This may cause bleeding.    apply powder or lotion to the site. Keep the site clean and dry.  Check your femoral site every day for signs of infection. Check for: ? Redness, swelling, or pain. ? Fluid or blood. ? Warmth. ? Pus or a bad smell. Activity  For the first 2-3 days after your procedure, or as long as directed: ? Avoid climbing stairs as much as possible. ? Do not squat.  Do not lift anything  that is heavier than 10 lb (4.5 kg), or the limit that you are told, until your health care provider says that it is safe.  Rest as directed. ? Avoid sitting for a long time without moving. Get up to take short walks every 1-2 hours.  Do not drive for 24 hours if you were given a medicine to help you relax (sedative). General instructions  Take over-the-counter and prescription medicines only as told by your health care provider.  Keep all follow-up visits as told by your health care provider. This is important. Contact a health care provider if you have:  A fever or chills.  You have redness, swelling, or pain around your insertion site. Get help right away if:  The catheter insertion area swells very fast.  You pass out.  You suddenly start to sweat or your skin gets clammy.  The catheter insertion area is bleeding, and the bleeding does not stop when you hold steady pressure on the area.  The area near or just beyond the catheter insertion site becomes pale, cool, tingly, or numb. These symptoms may represent a serious problem that is an emergency. Do not wait to see if the symptoms will go away. Get medical help right away. Call your local emergency services (911 in the [place] Do not drive yourself to the hospital. Summary  After the procedure, it is common to have bruising that usually fades within 1-2 weeks.  Check your femoral site every day for signs of infection.  Do not lift anything that is heavier than 10 lb (4.5 kg), or the limit that you are told, until your health care provider says that it is safe. This information is not intended to replace advice given to you by your health care provider. Make sure you discuss any questions you have with your health care provider. Document Revised: 12/11/2017 Document Reviewed: 12/11/2017 Elsevier Patient Education  2020 [place] After This sheet gives you information about how to care for yourself after  your procedure. Your health care provider may also give you more specific instructions. If you have problems or questions, contact your health care provider. What can I expect after the procedure? After the procedure, it is common to have bruising and tenderness at the catheter insertion area. Follow these instructions at home: Insertion site care  Follow instructions from your health care provider about how to take care of your insertion site. Make sure you: ? Wash your hands with soap and water before you change your bandage (dressing). If soap and water are not available, use hand sanitizer. ? Change your dressing as told by your health care provider. ? Leave stitches (sutures), skin glue, or adhesive strips in place. These skin closures may need to stay in place for 2 weeks or longer. If adhesive strip edges start to loosen and curl up, you may trim the loose edges. Do not remove adhesive strips completely unless your health care provider tells you to do that.  Do not take baths, swim, or use a hot tub until  your health care provider approves.  You may shower 24-48 hours after the procedure or as told by your health care provider. ? Gently wash the site with plain soap and water. ? Pat the area dry with a clean towel. ? Do not rub the site. This may cause bleeding.  Do not apply powder or lotion to the site. Keep the site clean and dry.  Check your insertion site every day for signs of infection. Check for: ? Redness, swelling, or pain. ? Fluid or blood. ? Warmth. ? Pus or a bad smell. Activity  Rest as told by your health care provider, usually for 1-2 days.  Do not lift anything that is heavier than 10 lbs. (4.5 kg) or as told by your health care provider.  Do not drive for 24 hours if you were given a medicine to help you relax (sedative).  Do not drive or use heavy machinery while taking prescription pain medicine. General instructions   Return to your normal activities  as told by your health care provider, usually in about a week. Ask your health care provider what activities are safe for you.  If the catheter site starts bleeding, lie flat and put pressure on the site. If the bleeding does not stop, get help right away. This is a medical emergency.  Drink enough fluid to keep your urine clear or pale yellow. This helps flush the contrast dye from your body.  Take over-the-counter and prescription medicines only as told by your health care provider.  Keep all follow-up visits as told by your health care provider. This is important. Contact a health care provider if:  You have a fever or chills.  You have redness, swelling, or pain around your insertion site.  You have fluid or blood coming from your insertion site.  The insertion site feels warm to the touch.  You have pus or a bad smell coming from your insertion site.  You have bruising around the insertion site.  You notice blood collecting in the tissue around the catheter site (hematoma). The hematoma may be painful to the touch. Get help right away if:  You have severe pain at the catheter insertion area.  The catheter insertion area swells very fast.  The catheter insertion area is bleeding, and the bleeding does not stop when you hold steady pressure on the area.  The area near or just beyond the catheter insertion site becomes pale, cool, tingly, or numb. These symptoms may represent a serious problem that is an emergency. Do not wait to see if the symptoms will go away. Get medical help right away. Call your local emergency services (911 in the [place] Do not drive yourself to the hospital. Summary  After the procedure, it is common to have bruising and tenderness at the catheter insertion area.  After the procedure, it is important to rest and drink plenty of fluids.  Do not take baths, swim, or use a hot tub until your health care provider says it is okay to do so. You may shower  24-48 hours after the procedure or as told by your health care provider.  If the catheter site starts bleeding, lie flat and put pressure on the site. If the bleeding does not stop, get help right away. This is a medical emergency. This information is not intended to replace advice given to you by your health care provider. Make sure you discuss any questions you have with your health care provider. Document Revised: 11/10/2017  Document Reviewed: 11/02/2016 Elsevier Patient Education  [employer].

## 2020-06-17 ENCOUNTER — Encounter: Payer: Self-pay | Admitting: Vascular Surgery

## 2020-07-08 ENCOUNTER — Ambulatory Visit (INDEPENDENT_AMBULATORY_CARE_PROVIDER_SITE_OTHER): Payer: Medicare PPO | Admitting: Nurse Practitioner

## 2020-07-08 ENCOUNTER — Encounter (INDEPENDENT_AMBULATORY_CARE_PROVIDER_SITE_OTHER): Payer: Medicare PPO

## 2020-07-10 ENCOUNTER — Other Ambulatory Visit (INDEPENDENT_AMBULATORY_CARE_PROVIDER_SITE_OTHER): Payer: Self-pay | Admitting: Vascular Surgery

## 2020-07-10 DIAGNOSIS — Z95820 Peripheral vascular angioplasty status with implants and grafts: Secondary | ICD-10-CM

## 2020-07-10 DIAGNOSIS — I701 Atherosclerosis of renal artery: Secondary | ICD-10-CM

## 2020-07-13 ENCOUNTER — Ambulatory Visit (INDEPENDENT_AMBULATORY_CARE_PROVIDER_SITE_OTHER): Payer: Medicare PPO | Admitting: Vascular Surgery

## 2020-07-13 ENCOUNTER — Encounter (INDEPENDENT_AMBULATORY_CARE_PROVIDER_SITE_OTHER): Payer: Self-pay | Admitting: Vascular Surgery

## 2020-07-13 ENCOUNTER — Ambulatory Visit (INDEPENDENT_AMBULATORY_CARE_PROVIDER_SITE_OTHER): Payer: Medicare PPO

## 2020-07-13 ENCOUNTER — Other Ambulatory Visit: Payer: Self-pay

## 2020-07-13 VITALS — BP 96/58 | HR 51 | Resp 16 | Wt 110.8 lb

## 2020-07-13 DIAGNOSIS — I739 Peripheral vascular disease, unspecified: Secondary | ICD-10-CM

## 2020-07-13 DIAGNOSIS — E782 Mixed hyperlipidemia: Secondary | ICD-10-CM

## 2020-07-13 DIAGNOSIS — I25708 Atherosclerosis of coronary artery bypass graft(s), unspecified, with other forms of angina pectoris: Secondary | ICD-10-CM | POA: Diagnosis not present

## 2020-07-13 DIAGNOSIS — Z95820 Peripheral vascular angioplasty status with implants and grafts: Secondary | ICD-10-CM | POA: Diagnosis not present

## 2020-07-13 DIAGNOSIS — I701 Atherosclerosis of renal artery: Secondary | ICD-10-CM | POA: Diagnosis not present

## 2020-07-13 DIAGNOSIS — I10 Essential (primary) hypertension: Secondary | ICD-10-CM | POA: Diagnosis not present

## 2020-07-13 NOTE — Progress Notes (Signed)
MRN : [id number]  Morgan Ryan is a 69 y.o. (1951-05-19) female who presents with chief complaint of No chief complaint on file. Marland Kitchen  History of Present Illness:   The patient returns to the office for followup s/p repeat right renal artery stenting for recurrent renal artery stenosis. There have been no interval changes in the patient's blood pressure control.  She denies any major changes in her medications.  The patient denies headache or flushing.  No flank or unusual back pain.    There have been no significant changes to the patient's overall health care.  No interval shortening of the patient's walking distance or new symptoms consistent with claudication.  The patient denies the  development of rest pain symptoms. No new ulcers or wounds have occurred since the last visit.  The patient denies amaurosis fugax or recent TIA symptoms. There are no recent neurological changes noted. The patient denies history of DVT, PE or superficial thrombophlebitis. The patient denies recent episodes of angina or shortness of breath.   No outpatient medications have been marked as taking for the 07/13/20 encounter (Appointment) with Delana Meyer, Dolores Lory, MD.    Past Medical History:  Diagnosis Date  . Alopecia   . Chronic kidney disease    history nephrolithiasis  . Coronary artery disease   . Hyperparathyroidism ([place])    total calcium highest 12.2in 2/05, PTH 67. b. Sestamibi 6/04 revealed no adenoma osteoporosis with t score of -2.67 in femoral neck and   . Hypertension   . Renal artery stenosis [employer])     Past Surgical History:  Procedure Laterality Date  . APPENDECTOMY    . CORONARY ARTERY BYPASS GRAFT    . ESOPHAGOGASTRODUODENOSCOPY (EGD) WITH PROPOFOL N/A 03/07/2018   Procedure: ESOPHAGOGASTRODUODENOSCOPY (EGD) WITH PROPOFOL;  Surgeon: Toledo, Benay Pike, MD;  Location: [facility] ENDOSCOPY;  Service: Gastroenterology;  Laterality: N/A;  . PARATHYROIDECTOMY     particial   . RENAL  ANGIOGRAPHY Right 05/12/2020   Procedure: RENAL ANGIOGRAPHY;  Surgeon: Katha Cabal, MD;  Location: [place] [facility];  Service: Cardiovascular;  Laterality: Right;  . RENAL ANGIOGRAPHY Left 06/16/2020   Procedure: RENAL ANGIOGRAPHY;  Surgeon: Katha Cabal, MD;  Location: [place] [facility];  Service: Cardiovascular;  Laterality: Left;  . STENT PLACEMENT VASCULAR ([place] [facility])    . TONSILLECTOMY      Social History Social History   Tobacco Use  . Smoking status: Never Smoker  . Smokeless tobacco: Never Used  Vaping Use  . Vaping Use: Never used  Substance Use Topics  . Alcohol use: Never  . Drug use: Never    Family History Family History  Problem Relation Age of Onset  . Heart disease Mother   . Hypertension Father   . Hyperlipidemia Father   . Cancer Father   . Diabetes Father   . Heart attack Father   . Heart disease Paternal Grandmother     Allergies  Allergen Reactions  . Crestor [Rosuvastatin Calcium] Other (See Comments)    Body pain  . Naproxen Nausea And Vomiting     REVIEW OF SYSTEMS (Negative unless checked)  Constitutional: [] Weight loss  [] Fever  [] Chills Cardiac: [] Chest pain   [] Chest pressure   [] Palpitations   [] Shortness of breath when laying flat   [] Shortness of breath with exertion. Vascular:  [] Pain in legs with walking   [] Pain in legs at rest  [] History of DVT   [] Phlebitis   [] Swelling in legs   []   Varicose veins   [] Non-healing ulcers Pulmonary:   [] Uses home oxygen   [] Productive cough   [] Hemoptysis   [] Wheeze  [] COPD   [] Asthma Neurologic:  [] Dizziness   [] Seizures   [] History of stroke   [] History of TIA  [] Aphasia   [] Vissual changes   [] Weakness or numbness in arm   [] Weakness or numbness in leg Musculoskeletal:   [] Joint swelling   [] Joint pain   [] Low back pain Hematologic:  [] Easy bruising  [] Easy bleeding   [] Hypercoagulable state   [] Anemic Gastrointestinal:  [] Diarrhea   [] Vomiting  [] Gastroesophageal reflux/heartburn    [] Difficulty swallowing. Genitourinary:  [] Chronic kidney disease   [] Difficult urination  [] Frequent urination   [] Blood in urine Skin:  [] Rashes   [] Ulcers  Psychological:  [] History of anxiety   []  History of major depression.  Physical Examination  There were no vitals filed for this visit. There is no height or weight on file to calculate BMI. Gen: WD/WN, NAD Head: [place]/AT, No temporalis wasting.  Ear/Nose/Throat: Hearing grossly intact, nares w/o erythema or drainage Eyes: PER, EOMI, sclera nonicteric.  Neck: Supple, no large masses.   Pulmonary:  Good air movement, no audible wheezing bilaterally, no use of accessory muscles.  Cardiac: RRR, no JVD Vascular: no renal artery bruits Vessel Right Left  Radial Palpable Palpable  Brachial Palpable Palpable  Carotid Palpable Palpable  Gastrointestinal: Non-distended. No guarding/no peritoneal signs.  Musculoskeletal: M/S 5/5 throughout.  No deformity or atrophy.  Neurologic: CN 2-12 intact. Symmetrical.  Speech is fluent. Motor exam as listed above. Psychiatric: Judgment intact, Mood & affect appropriate for pt's clinical situation. Dermatologic: No rashes or ulcers noted.  No changes consistent with cellulitis.   CBC No results found for: WBC, HGB, HCT, MCV, PLT  BMET    Component Value Date/Time   BUN 21 06/16/2020 0819   CREATININE 1.01 (H) 06/16/2020 0819   GFRNONAA 58 (L) 06/16/2020 0819   GFRAA >60 06/16/2020 0819   CrCl cannot be calculated (Patient's most recent lab result is older than the maximum 21 days allowed.).  COAG No results found for: INR, PROTIME  Radiology PERIPHERAL VASCULAR CATHETERIZATION  Result Date: 06/16/2020 See op note    Assessment/Plan 1. Renal artery stenosis (HCC) BP today was acceptable with systolic reading 96 and diastolic reading 58.  Given that optimal control of the patient's hypertension is important to minimize the risk of heart attack and/or CVA.  The patient's BP and  noninvasive studies suggest bilateral renal interventions are widely patent.  The patient will continue the current medications, with taking only 10 mg of Inderal in the am and 20 in the pm.  The primary medical service will continue aggressive antihypertensive therapy as per the AHA guidelines   - VAS [place] RENAL ARTERY DUPLEX; Future  2. PAD (peripheral artery disease) (HCC)  Recommend:  The patient has evidence of atherosclerosis of the lower extremities with claudication.  The patient does not voice lifestyle limiting changes at this point in time.  Similarly, her arm remains asymptomatic.  Noninvasive studies do not suggest clinically significant change.  No invasive studies, angiography or surgery at this time The patient should continue walking and begin a more formal exercise program.  The patient should continue antiplatelet therapy and aggressive treatment of the lipid abnormalities  No changes in the patient's medications at this time  - VAS [place] LOWER EXTREMITY ARTERIAL DUPLEX; Future  3. HTN (hypertension), benign Continue antihypertensive medications as noted, these medications have been reviewed and there  are one change at this time.   4. Coronary artery disease of bypass graft of native heart with stable angina pectoris ([place]) Continue cardiac and antihypertensive medications as already ordered and reviewed, no changes at this time.  Continue statin as ordered and reviewed, no changes at this time  Nitrates PRN for chest pain   5. Mixed hyperlipidemia Continue statin as ordered and reviewed, no changes at this time    Hortencia Pilar, MD  07/13/2020 8:39 AM

## 2020-12-17 ENCOUNTER — Other Ambulatory Visit: Payer: Self-pay | Admitting: Internal Medicine

## 2020-12-17 DIAGNOSIS — Z1231 Encounter for screening mammogram for malignant neoplasm of breast: Secondary | ICD-10-CM

## 2021-01-11 ENCOUNTER — Ambulatory Visit
Admission: RE | Admit: 2021-01-11 | Discharge: 2021-01-11 | Disposition: A | Discharge status: Home / Self Care | Payer: Medicare PPO | Source: Ambulatory Visit | Attending: Internal Medicine | Admitting: Internal Medicine

## 2021-01-11 ENCOUNTER — Other Ambulatory Visit: Payer: Self-pay

## 2021-01-11 DIAGNOSIS — Z1231 Encounter for screening mammogram for malignant neoplasm of breast: Secondary | ICD-10-CM | POA: Diagnosis present

## 2021-01-15 ENCOUNTER — Other Ambulatory Visit (INDEPENDENT_AMBULATORY_CARE_PROVIDER_SITE_OTHER): Payer: Self-pay | Admitting: Vascular Surgery

## 2021-01-15 DIAGNOSIS — Z95828 Presence of other vascular implants and grafts: Secondary | ICD-10-CM

## 2021-01-15 DIAGNOSIS — I771 Stricture of artery: Secondary | ICD-10-CM

## 2021-01-15 DIAGNOSIS — I739 Peripheral vascular disease, unspecified: Secondary | ICD-10-CM

## 2021-01-15 DIAGNOSIS — I701 Atherosclerosis of renal artery: Secondary | ICD-10-CM

## 2021-01-16 NOTE — Progress Notes (Signed)
MRN : [id number]  Morgan Ryan is a 70 y.o. ([birth year]) female who presents with chief complaint of No chief complaint on file. Marland Kitchen  History of Present Illness:   The patient returns to the office for followups/p repeat right renal artery stenting for recurrentrenal artery stenosis. There have been no interval changes in the patient's blood pressure control. Shedenies any major changes in hermedications. The patient denies headache or flushing. No flank or unusual back pain.   There have been no significant changes to the patient's overall health care.  No interval shortening of the patient's walking distance or new symptoms consistent with claudication. The patient denies the development of rest pain symptoms. No new ulcers or wounds have occurred since the last visit.  The patient denies amaurosis fugax or recent TIA symptoms. There are no recent neurological changes noted. The patient denies history of DVT, PE or superficial thrombophlebitis. The patient denies recent episodes of angina or shortness of breath.  No outpatient medications have been marked as taking for the 01/18/21 encounter (Appointment) with Delana Meyer, Dolores Lory, MD.    Past Medical History:  Diagnosis Date  . Alopecia   . Chronic kidney disease    history nephrolithiasis  . Coronary artery disease   . Hyperparathyroidism ([place])    total calcium highest 12.2in 2/05, PTH 67. b. Sestamibi 6/04 revealed no adenoma osteoporosis with t score of -2.67 in femoral neck and   . Hypertension   . Renal artery stenosis [facility])     Past Surgical History:  Procedure Laterality Date  . APPENDECTOMY    . CORONARY ARTERY BYPASS GRAFT    . ESOPHAGOGASTRODUODENOSCOPY (EGD) WITH PROPOFOL N/A 03/07/2018   Procedure: ESOPHAGOGASTRODUODENOSCOPY (EGD) WITH PROPOFOL;  Surgeon: Toledo, Benay Pike, MD;  Location: [facility] ENDOSCOPY;  Service: Gastroenterology;  Laterality: N/A;  . PARATHYROIDECTOMY     particial   . RENAL  ANGIOGRAPHY Right 05/12/2020   Procedure: RENAL ANGIOGRAPHY;  Surgeon: Katha Cabal, MD;  Location: [place] CV LAB;  Service: Cardiovascular;  Laterality: Right;  . RENAL ANGIOGRAPHY Left 06/16/2020   Procedure: RENAL ANGIOGRAPHY;  Surgeon: Katha Cabal, MD;  Location: [place] [facility];  Service: Cardiovascular;  Laterality: Left;  . STENT PLACEMENT VASCULAR ([place] HX)    . TONSILLECTOMY      Social History Social History   Tobacco Use  . Smoking status: Never Smoker  . Smokeless tobacco: Never Used  Vaping Use  . Vaping Use: Never used  Substance Use Topics  . Alcohol use: Never  . Drug use: Never    Family History Family History  Problem Relation Age of Onset  . Heart disease Mother   . Hypertension Father   . Hyperlipidemia Father   . Cancer Father   . Diabetes Father   . Heart attack Father   . Heart disease Paternal Grandmother     Allergies  Allergen Reactions  . Crestor [Rosuvastatin Calcium] Other (See Comments)    Body pain  . Naproxen Nausea And Vomiting     REVIEW OF SYSTEMS (Negative unless checked)  Constitutional: [] Weight loss  [] Fever  [] Chills Cardiac: [] Chest pain   [] Chest pressure   [] Palpitations   [] Shortness of breath when laying flat   [] Shortness of breath with exertion. Vascular:  [] Pain in legs with walking   [] Pain in legs at rest  [] History of DVT   [] Phlebitis   [] Swelling in legs   [] Varicose veins   [] Non-healing ulcers Pulmonary:   [] Uses home  oxygen   [] Productive cough   [] Hemoptysis   [] Wheeze  [] COPD   [] Asthma Neurologic:  [] Dizziness   [] Seizures   [] History of stroke   [] History of TIA  [] Aphasia   [] Vissual changes   [] Weakness or numbness in arm   [] Weakness or numbness in leg Musculoskeletal:   [] Joint swelling   [] Joint pain   [] Low back pain Hematologic:  [] Easy bruising  [] Easy bleeding   [] Hypercoagulable state   [] Anemic Gastrointestinal:  [] Diarrhea   [] Vomiting  [] Gastroesophageal reflux/heartburn    [] Difficulty swallowing. Genitourinary:  [] Chronic kidney disease   [] Difficult urination  [] Frequent urination   [] Blood in urine Skin:  [] Rashes   [] Ulcers  Psychological:  [] History of anxiety   []  History of major depression.  Physical Examination  There were no vitals filed for this visit. There is no height or weight on file to calculate BMI. Gen: WD/WN, NAD Head: [place]/AT, No temporalis wasting.  Ear/Nose/Throat: Hearing grossly intact, nares w/o erythema or drainage Eyes: PER, EOMI, sclera nonicteric.  Neck: Supple, no large masses.   Pulmonary:  Good air movement, no audible wheezing bilaterally, no use of accessory muscles.  Cardiac: RRR, no JVD Vascular: Vessel Right Left  Radial 2+ Palpable Trace Palpable  Ulnar Palpable Not Palpable  Brachial Palpable 1+ Palpable  Carotid Palpable Palpable  Gastrointestinal: Non-distended. No guarding/no peritoneal signs.  Musculoskeletal: M/S 5/5 throughout.  No deformity or atrophy.  Neurologic: CN 2-12 intact. Symmetrical.  Speech is fluent. Motor exam as listed above. Psychiatric: Judgment intact, Mood & affect appropriate for pt's clinical situation. Dermatologic: No rashes or ulcers noted.  No changes consistent with cellulitis. Lymph : No lichenification or skin changes of chronic lymphedema.  CBC No results found for: WBC, HGB, HCT, MCV, PLT  BMET    Component Value Date/Time   BUN 21 06/16/2020 0819   CREATININE 1.01 (H) 06/16/2020 0819   GFRNONAA 58 (L) 06/16/2020 0819   GFRAA >60 06/16/2020 0819   CrCl cannot be calculated (Patient's most recent lab result is older than the maximum 21 days allowed.).  COAG No results found for: INR, PROTIME  Radiology MM 3D SCREEN BREAST BILATERAL  Result Date: 01/11/2021 CLINICAL DATA:  Screening. EXAM: DIGITAL SCREENING BILATERAL MAMMOGRAM WITH TOMO AND CAD COMPARISON:  Previous exam(s). ACR Breast Density Category b: There are scattered areas of fibroglandular density. FINDINGS:  There are no findings suspicious for malignancy. The images were evaluated with computer-aided detection. IMPRESSION: No mammographic evidence of malignancy. A result letter of this screening mammogram will be mailed directly to the patient. RECOMMENDATION: Screening mammogram in one year. (Code:SM-B-01Y) BI-RADS CATEGORY  1: Negative. Electronically Signed   By: Nolon Nations M.D.   On: 01/11/2021 15:37      Assessment/Plan 1. Renal artery stenosis (HCC) BP today was acceptable with systolic reading 96 and diastolic reading 58.  Given that optimal control of the patient's hypertension is important to minimize the risk of heart attack and/or CVA.  The patient's BP and noninvasive studies suggest bilateral renal interventions are widely patent.  The patient will continue the current medications.  The primary medical service will continue aggressive antihypertensive therapy as per the AHA guidelines - VAS US RENAL ARTERY DUPLEX; Future  2. PAD (peripheral artery disease) (HCC) Recommend:  The patient has evidence of atherosclerosis of the lower extremities with claudication.  The patient does not voice lifestyle limiting changes at this point in time.  Similarly, her arm remains asymptomatic.  Noninvasive studies do not suggest  clinically significant change.  No invasive studies, angiography or surgery at this time The patient should continue walking and begin a more formal exercise program.  The patient should continue antiplatelet therapy and aggressive treatment of the lipid abnormalities  No changes in the patient's medications at this time - VAS [place] UPPER EXTREMITY ARTERIAL DUPLEX; Future  3. Coronary artery disease of bypass graft of native heart with stable angina pectoris (HCC) Continue cardiac and antihypertensive medications as already ordered and reviewed, no changes at this time.  Continue statin as ordered and reviewed, no changes at this time  Nitrates PRN  for chest pain   4. HTN (hypertension), benign Continue antihypertensive medications as already ordered, these medications have been reviewed and there are no changes at this time.   5. Mixed hyperlipidemia Continue statin as ordered and reviewed, no changes at this time    Hortencia Pilar, MD  01/16/2021 2:02 PM

## 2021-01-18 ENCOUNTER — Ambulatory Visit (INDEPENDENT_AMBULATORY_CARE_PROVIDER_SITE_OTHER): Payer: Medicare PPO

## 2021-01-18 ENCOUNTER — Other Ambulatory Visit: Payer: Self-pay

## 2021-01-18 ENCOUNTER — Encounter (INDEPENDENT_AMBULATORY_CARE_PROVIDER_SITE_OTHER): Payer: Self-pay | Admitting: Vascular Surgery

## 2021-01-18 ENCOUNTER — Ambulatory Visit (INDEPENDENT_AMBULATORY_CARE_PROVIDER_SITE_OTHER): Payer: Medicare PPO | Admitting: Vascular Surgery

## 2021-01-18 DIAGNOSIS — I739 Peripheral vascular disease, unspecified: Secondary | ICD-10-CM

## 2021-01-18 DIAGNOSIS — E782 Mixed hyperlipidemia: Secondary | ICD-10-CM

## 2021-01-18 DIAGNOSIS — I10 Essential (primary) hypertension: Secondary | ICD-10-CM | POA: Diagnosis not present

## 2021-01-18 DIAGNOSIS — I25708 Atherosclerosis of coronary artery bypass graft(s), unspecified, with other forms of angina pectoris: Secondary | ICD-10-CM

## 2021-01-18 DIAGNOSIS — I701 Atherosclerosis of renal artery: Secondary | ICD-10-CM

## 2021-01-18 DIAGNOSIS — Z95828 Presence of other vascular implants and grafts: Secondary | ICD-10-CM | POA: Diagnosis not present

## 2021-01-18 DIAGNOSIS — I771 Stricture of artery: Secondary | ICD-10-CM | POA: Diagnosis not present

## 2021-02-10 ENCOUNTER — Encounter: Payer: Self-pay | Admitting: Ophthalmology

## 2021-02-10 ENCOUNTER — Other Ambulatory Visit: Payer: Self-pay

## 2021-02-12 ENCOUNTER — Other Ambulatory Visit: Payer: Self-pay

## 2021-02-12 ENCOUNTER — Other Ambulatory Visit
Admission: RE | Admit: 2021-02-12 | Discharge: 2021-02-12 | Disposition: A | Discharge status: Home / Self Care | Payer: Medicare PPO | Source: Ambulatory Visit | Attending: Ophthalmology | Admitting: Ophthalmology

## 2021-02-12 DIAGNOSIS — Z20822 Contact with and (suspected) exposure to covid-19: Secondary | ICD-10-CM | POA: Insufficient documentation

## 2021-02-12 DIAGNOSIS — Z01812 Encounter for preprocedural laboratory examination: Secondary | ICD-10-CM | POA: Insufficient documentation

## 2021-02-12 NOTE — Discharge Instructions (Signed)

## 2021-02-13 LAB — SARS CORONAVIRUS 2 (TAT 6-24 HRS): SARS Coronavirus 2: NEGATIVE

## 2021-02-16 ENCOUNTER — Other Ambulatory Visit: Payer: Self-pay

## 2021-02-16 ENCOUNTER — Ambulatory Visit: Payer: Medicare PPO | Admitting: Anesthesiology

## 2021-02-16 ENCOUNTER — Encounter: Payer: Self-pay | Admitting: Ophthalmology

## 2021-02-16 ENCOUNTER — Encounter
Admission: RE | Disposition: A | Discharge status: Home / Self Care | Payer: Self-pay | Source: Home / Self Care | Attending: Ophthalmology

## 2021-02-16 ENCOUNTER — Ambulatory Visit
Admission: RE | Admit: 2021-02-16 | Discharge: 2021-02-16 | Disposition: A | Discharge status: Home / Self Care | Payer: Medicare PPO | Attending: Ophthalmology | Admitting: Ophthalmology

## 2021-02-16 DIAGNOSIS — Z79899 Other long term (current) drug therapy: Secondary | ICD-10-CM | POA: Diagnosis not present

## 2021-02-16 DIAGNOSIS — Z87442 Personal history of urinary calculi: Secondary | ICD-10-CM | POA: Diagnosis not present

## 2021-02-16 DIAGNOSIS — Z809 Family history of malignant neoplasm, unspecified: Secondary | ICD-10-CM | POA: Diagnosis not present

## 2021-02-16 DIAGNOSIS — Z8349 Family history of other endocrine, nutritional and metabolic diseases: Secondary | ICD-10-CM | POA: Diagnosis not present

## 2021-02-16 DIAGNOSIS — Z791 Long term (current) use of non-steroidal anti-inflammatories (NSAID): Secondary | ICD-10-CM | POA: Insufficient documentation

## 2021-02-16 DIAGNOSIS — Z7902 Long term (current) use of antithrombotics/antiplatelets: Secondary | ICD-10-CM | POA: Diagnosis not present

## 2021-02-16 DIAGNOSIS — Z951 Presence of aortocoronary bypass graft: Secondary | ICD-10-CM | POA: Diagnosis not present

## 2021-02-16 DIAGNOSIS — Z833 Family history of diabetes mellitus: Secondary | ICD-10-CM | POA: Insufficient documentation

## 2021-02-16 DIAGNOSIS — H2511 Age-related nuclear cataract, right eye: Secondary | ICD-10-CM | POA: Insufficient documentation

## 2021-02-16 DIAGNOSIS — Z8249 Family history of ischemic heart disease and other diseases of the circulatory system: Secondary | ICD-10-CM | POA: Diagnosis not present

## 2021-02-16 DIAGNOSIS — Z959 Presence of cardiac and vascular implant and graft, unspecified: Secondary | ICD-10-CM | POA: Insufficient documentation

## 2021-02-16 HISTORY — DX: Personal history of other diseases of the digestive system: Z87.19

## 2021-02-16 HISTORY — DX: Presence of dental prosthetic device (complete) (partial): Z97.2

## 2021-02-16 HISTORY — PX: CATARACT EXTRACTION W/PHACO: SHX586

## 2021-02-16 HISTORY — DX: Age-related osteoporosis without current pathological fracture: M81.0

## 2021-02-16 SURGERY — PHACOEMULSIFICATION, CATARACT, WITH IOL INSERTION
Anesthesia: Monitor Anesthesia Care | Site: Eye | Laterality: Right

## 2021-02-16 MED ORDER — BRIMONIDINE TARTRATE-TIMOLOL 0.2-0.5 % OP SOLN
OPHTHALMIC | Status: DC | PRN
  Administered 2021-02-16: 1 [drp] via OPHTHALMIC

## 2021-02-16 MED ORDER — LIDOCAINE HCL (PF) 2 % IJ SOLN
INTRAOCULAR | Status: DC | PRN
  Administered 2021-02-16: 2 mL

## 2021-02-16 MED ORDER — FENTANYL CITRATE (PF) 100 MCG/2ML IJ SOLN
INTRAMUSCULAR | Status: DC | PRN
  Administered 2021-02-16: 50 ug via INTRAVENOUS

## 2021-02-16 MED ORDER — TETRACAINE HCL 0.5 % OP SOLN
1.0000 [drp] | OPHTHALMIC | Status: DC | PRN
  Administered 2021-02-16 (×3): 1 [drp] via OPHTHALMIC

## 2021-02-16 MED ORDER — NA CHONDROIT SULF-NA HYALURON 40-17 MG/ML IO SOLN
INTRAOCULAR | Status: DC | PRN
  Administered 2021-02-16: 1 mL via INTRAOCULAR

## 2021-02-16 MED ORDER — MOXIFLOXACIN HCL 0.5 % OP SOLN
OPHTHALMIC | Status: DC | PRN
  Administered 2021-02-16: 0.2 mL via OPHTHALMIC

## 2021-02-16 MED ORDER — ARMC OPHTHALMIC DILATING DROPS
1.0000 "application " | OPHTHALMIC | Status: DC | PRN
  Administered 2021-02-16 (×3): 1 via OPHTHALMIC

## 2021-02-16 MED ORDER — EPINEPHRINE PF 1 MG/ML IJ SOLN
INTRAOCULAR | Status: DC | PRN
  Administered 2021-02-16: 37 mL via OPHTHALMIC

## 2021-02-16 MED ORDER — MIDAZOLAM HCL 2 MG/2ML IJ SOLN
INTRAMUSCULAR | Status: DC | PRN
  Administered 2021-02-16: 1 mg via INTRAVENOUS

## 2021-02-16 SURGICAL SUPPLY — 19 items
CANNULA ANT/CHMB 27G (MISCELLANEOUS) ×2 IMPLANT
CANNULA ANT/CHMB 27GA (MISCELLANEOUS) ×4 IMPLANT
GLOVE SURG LX 8.0 MICRO (GLOVE) ×1
GLOVE SURG LX STRL 8.0 MICRO (GLOVE) ×1 IMPLANT
GLOVE SURG TRIUMPH 8.0 PF LTX (GLOVE) ×2 IMPLANT
GOWN STRL REUS W/ TWL LRG LVL3 (GOWN DISPOSABLE) ×2 IMPLANT
GOWN STRL REUS W/TWL LRG LVL3 (GOWN DISPOSABLE) ×4
LENS IOL TECNIS EYHANCE 19.5 (Intraocular Lens) ×1 IMPLANT
MARKER SKIN DUAL TIP RULER LAB (MISCELLANEOUS) ×2 IMPLANT
NDL FILTER BLUNT 18X1 1/2 (NEEDLE) ×1 IMPLANT
NEEDLE FILTER BLUNT 18X 1/2SAF (NEEDLE) ×1
NEEDLE FILTER BLUNT 18X1 1/2 (NEEDLE) ×1 IMPLANT
PACK EYE AFTER SURG (MISCELLANEOUS) ×2 IMPLANT
PACK OPTHALMIC (MISCELLANEOUS) ×2 IMPLANT
PACK PORFILIO (MISCELLANEOUS) ×2 IMPLANT
SYR 3ML LL SCALE MARK (SYRINGE) ×2 IMPLANT
SYR TB 1ML LUER SLIP (SYRINGE) ×2 IMPLANT
WATER STERILE IRR 250ML POUR (IV SOLUTION) ×2 IMPLANT
WIPE NON LINTING 3.25X3.25 (MISCELLANEOUS) ×2 IMPLANT

## 2021-02-16 NOTE — Anesthesia Procedure Notes (Signed)
Procedure Name: MAC Date/Time: 02/16/2021 8:55 AM Performed by: Cameron Ali, CRNA Pre-anesthesia Checklist: Patient identified, Emergency Drugs available, Suction available, Timeout performed and Patient being monitored Patient Re-evaluated:Patient Re-evaluated prior to induction Oxygen Delivery Method: Nasal cannula Placement Confirmation: positive ETCO2

## 2021-02-16 NOTE — Op Note (Signed)
PREOPERATIVE DIAGNOSIS:  Nuclear sclerotic cataract of the right eye.   POSTOPERATIVE DIAGNOSIS:  Cataract   OPERATIVE PROCEDURE:@   SURGEON:  Birder Robson, MD.   ANESTHESIA:  Anesthesiologist: Page, Adele Barthel, MD CRNA: Cameron Ali, CRNA  1.      Managed anesthesia care. 2.      0.1ml of Shugarcaine was instilled in the eye following the paracentesis.   COMPLICATIONS:  None.   TECHNIQUE:   Stop and chop   DESCRIPTION OF PROCEDURE:  The patient was examined and consented in the preoperative holding area where the aforementioned topical anesthesia was applied to the right eye and then brought back to the Operating Room where the right eye was prepped and draped in the usual sterile ophthalmic fashion and a lid speculum was placed. A paracentesis was created with the side port blade and the anterior chamber was filled with viscoelastic. A near clear corneal incision was performed with the steel keratome. A continuous curvilinear capsulorrhexis was performed with a cystotome followed by the capsulorrhexis forceps. Hydrodissection and hydrodelineation were carried out with BSS on a blunt cannula. The lens was removed in a stop and chop  technique and the remaining cortical material was removed with the irrigation-aspiration handpiece. The capsular bag was inflated with viscoelastic and the Technis [id number]  lens was placed in the capsular bag without complication. The remaining viscoelastic was removed from the eye with the irrigation-aspiration handpiece. The wounds were hydrated. The anterior chamber was flushed with BSS and the eye was inflated to physiologic pressure. 0.75ml of Vigamox was placed in the anterior chamber. The wounds were found to be water tight. The eye was dressed with Combigan. The patient was given protective glasses to wear throughout the day and a shield with which to sleep tonight. The patient was also given drops with which to begin a drop regimen today and will follow-up  with me in one day. Implant Name Type Inv. Item Serial No. Manufacturer Lot No. LRB No. Used Action  LENS IOL TECNIS EYHANCE 19.5 - [id number] Intraocular Lens LENS IOL TECNIS EYHANCE 19.5 [id number] JOHNSON   Right 1 Implanted   Procedure(s): CATARACT EXTRACTION PHACO AND INTRAOCULAR LENS PLACEMENT (IOC) RIGHT 3.84 00:23.8 (Right)  Electronically signed: Birder Robson 02/16/2021 9:03 AM

## 2021-02-16 NOTE — H&P (Signed)
[facility]   Primary Care Physician:  Kirk Ruths, MD Ophthalmologist: Dr. Benay Pillow  Pre-Procedure History & Physical: HPI:  Morgan Ryan is a 70 y.o. female here for cataract surgery.   Past Medical History:  Diagnosis Date  . Alopecia   . Chronic kidney disease    history nephrolithiasis  . Coronary artery disease   . History of hiatal hernia   . Hyperparathyroidism ([place])    total calcium highest 12.2in 2/05, PTH 67. b. Sestamibi 6/04 revealed no adenoma osteoporosis with t score of -2.67 in femoral neck and   . Hypertension   . Osteoporosis   . Renal artery stenosis ([place])   . Wears dentures    full upper, partial lower    Past Surgical History:  Procedure Laterality Date  . APPENDECTOMY    . CORONARY ARTERY BYPASS GRAFT    . ESOPHAGOGASTRODUODENOSCOPY (EGD) WITH PROPOFOL N/A 03/07/2018   Procedure: ESOPHAGOGASTRODUODENOSCOPY (EGD) WITH PROPOFOL;  Surgeon: Toledo, Benay Pike, MD;  Location: [facility] ENDOSCOPY;  Service: Gastroenterology;  Laterality: N/A;  . PARATHYROIDECTOMY     particial   . RENAL ANGIOGRAPHY Right 05/12/2020   Procedure: RENAL ANGIOGRAPHY;  Surgeon: Katha Cabal, MD;  Location: [place] [facility];  Service: Cardiovascular;  Laterality: Right;  . RENAL ANGIOGRAPHY Left 06/16/2020   Procedure: RENAL ANGIOGRAPHY;  Surgeon: Katha Cabal, MD;  Location: [place] [facility];  Service: Cardiovascular;  Laterality: Left;  . STENT PLACEMENT VASCULAR ([place] HX)    . TONSILLECTOMY      Prior to Admission medications   Medication Sig Start Date End Date Taking? Authorizing Provider  atorvastatin (LIPITOR) 40 MG tablet Take 40 mg by mouth at bedtime.    Yes [provider]  Biotin 1000 MCG CHEW Chew 1,000 mg by mouth daily.    Yes [provider]  clopidogrel (PLAVIX) 75 MG tablet Take 75 mg by mouth daily.   Yes [provider]  ezetimibe (ZETIA) 10 MG tablet Take 10 mg by mouth daily.  03/04/20  Yes  [provider]  Glycerin-Hypromellose-PEG 400 (DRY EYE RELIEF DROPS) 0.2-0.2-1 % SOLN Place 1 drop into both eyes daily as needed (Dry eye).   Yes [provider]  propranolol (INDERAL) 20 MG tablet Take 20 mg by mouth 2 (two) times daily.   Yes [provider]  raloxifene (EVISTA) 60 MG tablet Take 60 mg by mouth daily.   Yes [provider]    Allergies as of 12/25/2020 - Review Complete 07/13/2020  Allergen Reaction Noted  . Crestor [rosuvastatin calcium] Other (See Comments) 03/06/2018  . Naproxen Nausea And Vomiting 03/06/2018    Family History  Problem Relation Age of Onset  . Heart disease Mother   . Hypertension Father   . Hyperlipidemia Father   . Cancer Father   . Diabetes Father   . Heart attack Father   . Heart disease Paternal Grandmother     Social History   Socioeconomic History  . Marital status: Divorced    Spouse name: Not on file  . Number of children: Not on file  . Years of education: Not on file  . Highest education level: Not on file  Occupational History  . Not on file  Tobacco Use  . Smoking status: Never Smoker  . Smokeless tobacco: Never Used  Vaping Use  . Vaping Use: Never used  Substance and Sexual Activity  . Alcohol use: Never  . Drug use: Never  . Sexual  activity: Not on file  Other Topics Concern  . Not on file  Social History Narrative  . Not on file   Social Determinants of Health   Financial Resource Strain: Not on file  Food Insecurity: Not on file  Transportation Needs: Not on file  Physical Activity: Not on file  Stress: Not on file  Social Connections: Not on file  Intimate Partner Violence: Not on file    Review of Systems: See HPI, otherwise negative ROS  Physical Exam: BP (!) 166/91   Pulse 60   Temp (!) 97.5 F (36.4 C) (Temporal)   Resp 16   Ht 5\' 3"  (1.6 m)   Wt 49.4 kg   SpO2 99%   BMI 19.31 kg/m  General:   Alert,  pleasant and cooperative in NAD Head:   Normocephalic and atraumatic. Respiratory:  Normal work of breathing.  Impression/Plan: Morgan Ryan is here for cataract surgery.  Risks, benefits, limitations, and alternatives regarding cataract surgery have been reviewed with the patient.  Questions have been answered.  All parties agreeable.   Birder Robson, MD  02/16/2021, 8:43 AM

## 2021-02-16 NOTE — Transfer of Care (Signed)
Immediate Anesthesia Transfer of Care Note  Patient: Morgan Ryan  Procedure(s) Performed: CATARACT EXTRACTION PHACO AND INTRAOCULAR LENS PLACEMENT (IOC) RIGHT 3.84 00:23.8 (Right Eye)  Patient Location: PACU  Anesthesia Type: MAC  Level of Consciousness: awake, alert  and patient cooperative  Airway and Oxygen Therapy: Patient Spontanous Breathing and Patient connected to supplemental oxygen  Post-op Assessment: Post-op Vital signs reviewed, Patient's Cardiovascular Status Stable, Respiratory Function Stable, Patent Airway and No signs of Nausea or vomiting  Post-op Vital Signs: Reviewed and stable  Complications: No complications documented.

## 2021-02-16 NOTE — Anesthesia Preprocedure Evaluation (Signed)
Anesthesia Evaluation  Patient identified by MRN, date of birth, ID band Patient awake    Airway Mallampati: I  TM Distance: >3 FB Neck ROM: Full    Dental  (+) Upper Dentures   Pulmonary neg pulmonary ROS,    Pulmonary exam normal        Cardiovascular Exercise Tolerance: Good hypertension, Pt. on home beta blockers and Pt. on medications + Cardiac Stents (taking Plavix) and + CABG (20 years ago 4v)  Normal cardiovascular exam  subclavian and renal stents   Neuro/Psych negative neurological ROS     GI/Hepatic negative GI ROS, Neg liver ROS,   Endo/Other  negative endocrine ROS  Renal/GU negative Renal ROS     Musculoskeletal   Abdominal   Peds  Hematology negative hematology ROS (+)   Anesthesia Other Findings   Reproductive/Obstetrics                             Anesthesia Physical Anesthesia Plan  ASA: III  Anesthesia Plan: MAC   Post-op Pain Management:    Induction: Intravenous  PONV Risk Score and Plan: 2 and Midazolam, TIVA and Treatment may vary due to age or medical condition  Airway Management Planned: Natural Airway and Nasal Cannula  Additional Equipment: None  Intra-op Plan:   Post-operative Plan:   Informed Consent: I have reviewed the patients History and Physical, chart, labs and discussed the procedure including the risks, benefits and alternatives for the proposed anesthesia with the patient or authorized representative who has indicated his/her understanding and acceptance.       Plan Discussed with: CRNA  Anesthesia Plan Comments:         Anesthesia Quick Evaluation

## 2021-02-16 NOTE — Anesthesia Postprocedure Evaluation (Signed)
Anesthesia Post Note  Patient: Morgan Ryan  Procedure(s) Performed: CATARACT EXTRACTION PHACO AND INTRAOCULAR LENS PLACEMENT (IOC) RIGHT 3.84 00:23.8 (Right Eye)     Patient location during evaluation: PACU Anesthesia Type: MAC Level of consciousness: awake and alert Pain management: pain level controlled Vital Signs Assessment: post-procedure vital signs reviewed and stable Respiratory status: spontaneous breathing, nonlabored ventilation, respiratory function stable and patient connected to nasal cannula oxygen Cardiovascular status: stable and blood pressure returned to baseline Postop Assessment: no apparent nausea or vomiting Anesthetic complications: no   No complications documented.  Adele Barthel Glendine Swetz

## 2021-02-17 ENCOUNTER — Encounter: Payer: Self-pay | Admitting: Ophthalmology

## 2021-02-23 ENCOUNTER — Other Ambulatory Visit: Payer: Self-pay

## 2021-02-23 ENCOUNTER — Encounter: Payer: Self-pay | Admitting: Ophthalmology

## 2021-02-25 NOTE — Discharge Instructions (Signed)

## 2021-02-26 ENCOUNTER — Other Ambulatory Visit
Admission: RE | Admit: 2021-02-26 | Discharge: 2021-02-26 | Disposition: A | Discharge status: Home / Self Care | Payer: Medicare PPO | Source: Ambulatory Visit | Attending: Ophthalmology | Admitting: Ophthalmology

## 2021-02-26 ENCOUNTER — Other Ambulatory Visit: Payer: Self-pay

## 2021-02-26 DIAGNOSIS — Z20822 Contact with and (suspected) exposure to covid-19: Secondary | ICD-10-CM | POA: Diagnosis not present

## 2021-02-26 DIAGNOSIS — Z01812 Encounter for preprocedural laboratory examination: Secondary | ICD-10-CM | POA: Insufficient documentation

## 2021-02-27 LAB — SARS CORONAVIRUS 2 (TAT 6-24 HRS): SARS Coronavirus 2: NEGATIVE

## 2021-03-02 ENCOUNTER — Other Ambulatory Visit: Payer: Self-pay

## 2021-03-02 ENCOUNTER — Encounter: Payer: Self-pay | Admitting: Ophthalmology

## 2021-03-02 ENCOUNTER — Ambulatory Visit: Payer: Medicare PPO | Admitting: Anesthesiology

## 2021-03-02 ENCOUNTER — Encounter
Admission: RE | Disposition: A | Discharge status: Home / Self Care | Payer: Self-pay | Source: Home / Self Care | Attending: Ophthalmology

## 2021-03-02 ENCOUNTER — Ambulatory Visit
Admission: RE | Admit: 2021-03-02 | Discharge: 2021-03-02 | Disposition: A | Discharge status: Home / Self Care | Payer: Medicare PPO | Attending: Ophthalmology | Admitting: Ophthalmology

## 2021-03-02 DIAGNOSIS — N189 Chronic kidney disease, unspecified: Secondary | ICD-10-CM | POA: Diagnosis not present

## 2021-03-02 DIAGNOSIS — Z8349 Family history of other endocrine, nutritional and metabolic diseases: Secondary | ICD-10-CM | POA: Insufficient documentation

## 2021-03-02 DIAGNOSIS — I129 Hypertensive chronic kidney disease with stage 1 through stage 4 chronic kidney disease, or unspecified chronic kidney disease: Secondary | ICD-10-CM | POA: Insufficient documentation

## 2021-03-02 DIAGNOSIS — Z9841 Cataract extraction status, right eye: Secondary | ICD-10-CM | POA: Insufficient documentation

## 2021-03-02 DIAGNOSIS — H2512 Age-related nuclear cataract, left eye: Secondary | ICD-10-CM | POA: Diagnosis present

## 2021-03-02 DIAGNOSIS — Z833 Family history of diabetes mellitus: Secondary | ICD-10-CM | POA: Insufficient documentation

## 2021-03-02 DIAGNOSIS — Z8249 Family history of ischemic heart disease and other diseases of the circulatory system: Secondary | ICD-10-CM | POA: Insufficient documentation

## 2021-03-02 DIAGNOSIS — Z961 Presence of intraocular lens: Secondary | ICD-10-CM | POA: Diagnosis not present

## 2021-03-02 DIAGNOSIS — Z791 Long term (current) use of non-steroidal anti-inflammatories (NSAID): Secondary | ICD-10-CM | POA: Insufficient documentation

## 2021-03-02 DIAGNOSIS — Z7902 Long term (current) use of antithrombotics/antiplatelets: Secondary | ICD-10-CM | POA: Insufficient documentation

## 2021-03-02 DIAGNOSIS — Z951 Presence of aortocoronary bypass graft: Secondary | ICD-10-CM | POA: Diagnosis not present

## 2021-03-02 DIAGNOSIS — Z79899 Other long term (current) drug therapy: Secondary | ICD-10-CM | POA: Insufficient documentation

## 2021-03-02 HISTORY — PX: CATARACT EXTRACTION W/PHACO: SHX586

## 2021-03-02 SURGERY — PHACOEMULSIFICATION, CATARACT, WITH IOL INSERTION
Anesthesia: Monitor Anesthesia Care | Site: Eye | Laterality: Left

## 2021-03-02 MED ORDER — EPINEPHRINE PF 1 MG/ML IJ SOLN
INTRAOCULAR | Status: DC | PRN
  Administered 2021-03-02: 49 mL via OPHTHALMIC

## 2021-03-02 MED ORDER — NA CHONDROIT SULF-NA HYALURON 40-17 MG/ML IO SOLN
INTRAOCULAR | Status: DC | PRN
  Administered 2021-03-02: 1 mL via INTRAOCULAR

## 2021-03-02 MED ORDER — LACTATED RINGERS IV SOLN: INTRAVENOUS | Status: DC

## 2021-03-02 MED ORDER — MIDAZOLAM HCL 2 MG/2ML IJ SOLN
INTRAMUSCULAR | Status: DC | PRN
  Administered 2021-03-02: 1 mg via INTRAVENOUS

## 2021-03-02 MED ORDER — MOXIFLOXACIN HCL 0.5 % OP SOLN
OPHTHALMIC | Status: DC | PRN
  Administered 2021-03-02: 0.2 mL via OPHTHALMIC

## 2021-03-02 MED ORDER — LIDOCAINE HCL (PF) 2 % IJ SOLN
INTRAOCULAR | Status: DC | PRN
  Administered 2021-03-02: 1 mL

## 2021-03-02 MED ORDER — PHENYLEPHRINE HCL 10 % OP SOLN
1.0000 [drp] | OPHTHALMIC | Status: DC | PRN
  Administered 2021-03-02 (×3): 1 [drp] via OPHTHALMIC

## 2021-03-02 MED ORDER — TETRACAINE HCL 0.5 % OP SOLN
1.0000 [drp] | OPHTHALMIC | Status: DC | PRN
  Administered 2021-03-02 (×3): 1 [drp] via OPHTHALMIC

## 2021-03-02 MED ORDER — BRIMONIDINE TARTRATE-TIMOLOL 0.2-0.5 % OP SOLN
OPHTHALMIC | Status: DC | PRN
  Administered 2021-03-02: 1 [drp] via OPHTHALMIC

## 2021-03-02 MED ORDER — CYCLOPENTOLATE HCL 2 % OP SOLN
1.0000 [drp] | OPHTHALMIC | Status: DC | PRN
  Administered 2021-03-02 (×3): 1 [drp] via OPHTHALMIC

## 2021-03-02 MED ORDER — FENTANYL CITRATE (PF) 100 MCG/2ML IJ SOLN
INTRAMUSCULAR | Status: DC | PRN
  Administered 2021-03-02: 50 ug via INTRAVENOUS

## 2021-03-02 SURGICAL SUPPLY — 17 items
CANNULA ANT/CHMB 27G (MISCELLANEOUS) ×2 IMPLANT
CANNULA ANT/CHMB 27GA (MISCELLANEOUS) ×4 IMPLANT
GLOVE SURG TRIUMPH 8.0 PF LTX (GLOVE) ×2 IMPLANT
GOWN STRL REUS W/ TWL LRG LVL3 (GOWN DISPOSABLE) ×2 IMPLANT
GOWN STRL REUS W/TWL LRG LVL3 (GOWN DISPOSABLE) ×4
LENS IOL TECNIS EYHANCE 22.0 (Intraocular Lens) ×1 IMPLANT
MARKER SKIN DUAL TIP RULER LAB (MISCELLANEOUS) ×2 IMPLANT
NDL FILTER BLUNT 18X1 1/2 (NEEDLE) ×1 IMPLANT
NEEDLE FILTER BLUNT 18X 1/2SAF (NEEDLE) ×1
NEEDLE FILTER BLUNT 18X1 1/2 (NEEDLE) ×1 IMPLANT
PACK EYE AFTER SURG (MISCELLANEOUS) ×2 IMPLANT
PACK OPTHALMIC (MISCELLANEOUS) ×2 IMPLANT
PACK PORFILIO (MISCELLANEOUS) ×2 IMPLANT
SYR 3ML LL SCALE MARK (SYRINGE) ×2 IMPLANT
SYR TB 1ML LUER SLIP (SYRINGE) ×2 IMPLANT
WATER STERILE IRR 250ML POUR (IV SOLUTION) ×2 IMPLANT
WIPE NON LINTING 3.25X3.25 (MISCELLANEOUS) ×2 IMPLANT

## 2021-03-02 NOTE — Op Note (Signed)
PREOPERATIVE DIAGNOSIS:  Nuclear sclerotic cataract of the left eye.   POSTOPERATIVE DIAGNOSIS:  Nuclear sclerotic cataract of the left eye.   OPERATIVE PROCEDURE:@   SURGEON:  Birder Robson, MD.   ANESTHESIA:  No anesthesia staff entered.  1.      Managed anesthesia care. 2.     0.62ml of Shugarcaine was instilled following the paracentesis   COMPLICATIONS:  None.   TECHNIQUE:   Stop and chop   DESCRIPTION OF PROCEDURE:  The patient was examined and consented in the preoperative holding area where the aforementioned topical anesthesia was applied to the left eye and then brought back to the Operating Room where the left eye was prepped and draped in the usual sterile ophthalmic fashion and a lid speculum was placed. A paracentesis was created with the side port blade and the anterior chamber was filled with viscoelastic. A near clear corneal incision was performed with the steel keratome. A continuous curvilinear capsulorrhexis was performed with a cystotome followed by the capsulorrhexis forceps. Hydrodissection and hydrodelineation were carried out with BSS on a blunt cannula. The lens was removed in a stop and chop  technique and the remaining cortical material was removed with the irrigation-aspiration handpiece. The capsular bag was inflated with viscoelastic and the Technis [id number] lens was placed in the capsular bag without complication. The remaining viscoelastic was removed from the eye with the irrigation-aspiration handpiece. The wounds were hydrated. The anterior chamber was flushed with BSS and the eye was inflated to physiologic pressure. 0.4ml Vigamox was placed in the anterior chamber. The wounds were found to be water tight. The eye was dressed with Combigan. The patient was given protective glasses to wear throughout the day and a shield with which to sleep tonight. The patient was also given drops with which to begin a drop regimen today and will follow-up with me in one  day. Implant Name Type Inv. Item Serial No. Manufacturer Lot No. LRB No. Used Action  LENS IOL TECNIS EYHANCE 22.0 - [id number] Intraocular Lens LENS IOL TECNIS EYHANCE 22.0 [id number] JOHNSON   Left 1 Implanted    Procedure(s): CATARACT EXTRACTION PHACO AND INTRAOCULAR LENS PLACEMENT (IOC) LEFT 5.64 00:33.9 (Left)  Electronically signed: Birder Robson 03/02/2021 8:15 AM

## 2021-03-02 NOTE — Anesthesia Procedure Notes (Signed)
Procedure Name: MAC Date/Time: 03/02/2021 7:58 AM Performed by: Cameron Ali, CRNA Pre-anesthesia Checklist: Patient identified, Emergency Drugs available, Suction available, Timeout performed and Patient being monitored Patient Re-evaluated:Patient Re-evaluated prior to induction Oxygen Delivery Method: Nasal cannula Placement Confirmation: positive ETCO2

## 2021-03-02 NOTE — Transfer of Care (Signed)
Immediate Anesthesia Transfer of Care Note  Patient: Morgan Ryan  Procedure(s) Performed: CATARACT EXTRACTION PHACO AND INTRAOCULAR LENS PLACEMENT (IOC) LEFT 5.64 00:33.9 (Left Eye)  Patient Location: PACU  Anesthesia Type: MAC  Level of Consciousness: awake, alert  and patient cooperative  Airway and Oxygen Therapy: Patient Spontanous Breathing and Patient connected to supplemental oxygen  Post-op Assessment: Post-op Vital signs reviewed, Patient's Cardiovascular Status Stable, Respiratory Function Stable, Patent Airway and No signs of Nausea or vomiting  Post-op Vital Signs: Reviewed and stable  Complications: No complications documented.

## 2021-03-02 NOTE — Anesthesia Postprocedure Evaluation (Signed)
Anesthesia Post Note  Patient: Morgan Ryan  Procedure(s) Performed: CATARACT EXTRACTION PHACO AND INTRAOCULAR LENS PLACEMENT (IOC) LEFT 5.64 00:33.9 (Left Eye)     Patient location during evaluation: PACU Anesthesia Type: MAC Level of consciousness: awake and alert Pain management: pain level controlled Vital Signs Assessment: post-procedure vital signs reviewed and stable Respiratory status: spontaneous breathing, nonlabored ventilation, respiratory function stable and patient connected to nasal cannula oxygen Cardiovascular status: stable and blood pressure returned to baseline Postop Assessment: no apparent nausea or vomiting Anesthetic complications: no   No complications documented.  [name] [name]

## 2021-03-02 NOTE — Anesthesia Preprocedure Evaluation (Signed)
Anesthesia Evaluation  Patient identified by MRN, date of birth, ID band  History of Anesthesia Complications Negative for: history of anesthetic complications  Airway Mallampati: II  TM Distance: >3 FB Neck ROM: Full    Dental   Pulmonary neg pulmonary ROS,    Pulmonary exam normal        Cardiovascular hypertension, + CAD  Normal cardiovascular exam     Neuro/Psych    GI/Hepatic hiatal hernia,   Endo/Other    Renal/GU CRFRenal disease     Musculoskeletal   Abdominal   Peds  Hematology   Anesthesia Other Findings   Reproductive/Obstetrics                             Anesthesia Physical Anesthesia Plan  ASA: II  Anesthesia Plan: MAC   Post-op Pain Management:    Induction: Intravenous  PONV Risk Score and Plan:   Airway Management Planned: Nasal Cannula  Additional Equipment:   Intra-op Plan:   Post-operative Plan:   Informed Consent: I have reviewed the patients History and Physical, chart, labs and discussed the procedure including the risks, benefits and alternatives for the proposed anesthesia with the patient or authorized representative who has indicated his/her understanding and acceptance.       Plan Discussed with: CRNA  Anesthesia Plan Comments:         Anesthesia Quick Evaluation

## 2021-03-02 NOTE — H&P (Signed)
[facility]   Primary Care Physician:  Kirk Ruths, MD Ophthalmologist: Dr. George Ina  Pre-Procedure History & Physical: HPI:  Morgan Ryan is a 70 y.o. female here for cataract surgery.   Past Medical History:  Diagnosis Date  . Alopecia   . Chronic kidney disease    history nephrolithiasis  . Coronary artery disease   . History of hiatal hernia   . Hyperparathyroidism ([employer])    total calcium highest 12.2in 2/05, PTH 67. b. Sestamibi 6/04 revealed no adenoma osteoporosis with t score of -2.67 in femoral neck and   . Hypertension   . Osteoporosis   . Renal artery stenosis ([place])   . Wears dentures    full upper, partial lower    Past Surgical History:  Procedure Laterality Date  . APPENDECTOMY    . CATARACT EXTRACTION W/PHACO Right 02/16/2021   Procedure: CATARACT EXTRACTION PHACO AND INTRAOCULAR LENS PLACEMENT (IOC) RIGHT 3.84 00:23.8;  Surgeon: Birder Robson, MD;  Location: [facility];  Service: Ophthalmology;  Laterality: Right;  . CORONARY ARTERY BYPASS GRAFT    . ESOPHAGOGASTRODUODENOSCOPY (EGD) WITH PROPOFOL N/A 03/07/2018   Procedure: ESOPHAGOGASTRODUODENOSCOPY (EGD) WITH PROPOFOL;  Surgeon: [place], Benay Pike, MD;  Location: [facility];  Service: Gastroenterology;  Laterality: N/A;  . PARATHYROIDECTOMY     particial   . RENAL ANGIOGRAPHY Right 05/12/2020   Procedure: RENAL ANGIOGRAPHY;  Surgeon: Katha Cabal, MD;  Location: [facility];  Service: Cardiovascular;  Laterality: Right;  . RENAL ANGIOGRAPHY Left 06/16/2020   Procedure: RENAL ANGIOGRAPHY;  Surgeon: Katha Cabal, MD;  Location: [facility];  Service: Cardiovascular;  Laterality: Left;  . STENT PLACEMENT VASCULAR ([place] HX)    . TONSILLECTOMY      Prior to Admission medications   Medication Sig Start Date End Date Taking? Authorizing Provider  atorvastatin (LIPITOR) 40 MG tablet Take 40 mg by mouth at bedtime.    Yes [provider]  Biotin  1000 MCG CHEW Chew 1,000 mg by mouth daily.    Yes [provider]  clopidogrel (PLAVIX) 75 MG tablet Take 75 mg by mouth daily.   Yes [provider]  ezetimibe (ZETIA) 10 MG tablet Take 10 mg by mouth daily.  03/04/20  Yes [provider]  Glycerin-Hypromellose-PEG 400 (DRY EYE RELIEF DROPS) 0.2-0.2-1 % SOLN Place 1 drop into both eyes daily as needed (Dry eye).   Yes [provider]  propranolol (INDERAL) 20 MG tablet Take 20 mg by mouth 2 (two) times daily.   Yes [provider]  raloxifene (EVISTA) 60 MG tablet Take 60 mg by mouth daily.   Yes [provider]    Allergies as of 12/25/2020 - Review Complete 07/13/2020  Allergen Reaction Noted  . Crestor [rosuvastatin calcium] Other (See Comments) 03/06/2018  . Naproxen Nausea And Vomiting 03/06/2018    Family History  Problem Relation Age of Onset  . Heart disease Mother   . Hypertension Father   . Hyperlipidemia Father   . Cancer Father   . Diabetes Father   . Heart attack Father   . Heart disease Paternal Grandmother     Social History   Socioeconomic History  . Marital status: Divorced    Spouse name: Not on file  . Number of children: Not on file  . Years of education: Not on file  . Highest education level: Not on file  Occupational History  . Not on file  Tobacco Use  . Smoking  status: Never Smoker  . Smokeless tobacco: Never Used  Vaping Use  . Vaping Use: Never used  Substance and Sexual Activity  . Alcohol use: Never  . Drug use: Never  . Sexual activity: Not on file  Other Topics Concern  . Not on file  Social History Narrative  . Not on file   Social Determinants of Health   Financial Resource Strain: Not on file  Food Insecurity: Not on file  Transportation Needs: Not on file  Physical Activity: Not on file  Stress: Not on file  Social Connections: Not on file  Intimate Partner Violence: Not on file    Review of Systems: See HPI,  otherwise negative ROS  Physical Exam: BP 140/89   Pulse (!) 53   Temp 97.8 F (36.6 C) (Temporal)   Resp 18   Ht 5\' 3"  (1.6 m)   Wt 49.4 kg   SpO2 100%   BMI 19.31 kg/m  General:   Alert,  pleasant and cooperative in NAD Head:  Normocephalic and atraumatic. Respiratory:  Normal work of breathing.  Impression/Plan: Morgan Ryan is here for cataract surgery.  Risks, benefits, limitations, and alternatives regarding cataract surgery have been reviewed with the patient.  Questions have been answered.  All parties agreeable.   Birder Robson, MD  03/02/2021, 7:48 AM

## 2021-03-03 ENCOUNTER — Encounter: Payer: Self-pay | Admitting: Ophthalmology

## 2021-03-15 ENCOUNTER — Other Ambulatory Visit: Payer: Self-pay

## 2021-03-15 ENCOUNTER — Other Ambulatory Visit
Admission: RE | Admit: 2021-03-15 | Discharge: 2021-03-15 | Disposition: A | Discharge status: Home / Self Care | Payer: Medicare PPO | Source: Ambulatory Visit | Attending: Internal Medicine | Admitting: Internal Medicine

## 2021-03-15 DIAGNOSIS — Z20822 Contact with and (suspected) exposure to covid-19: Secondary | ICD-10-CM | POA: Diagnosis not present

## 2021-03-15 DIAGNOSIS — Z01812 Encounter for preprocedural laboratory examination: Secondary | ICD-10-CM | POA: Diagnosis present

## 2021-03-15 LAB — SARS CORONAVIRUS 2 (TAT 6-24 HRS): SARS Coronavirus 2: NEGATIVE

## 2021-03-16 ENCOUNTER — Encounter: Payer: Self-pay | Admitting: Internal Medicine

## 2021-03-17 ENCOUNTER — Ambulatory Visit
Admission: RE | Admit: 2021-03-17 | Discharge: 2021-03-17 | Disposition: A | Discharge status: Home / Self Care | Payer: Medicare PPO | Attending: Internal Medicine | Admitting: Internal Medicine

## 2021-03-17 ENCOUNTER — Ambulatory Visit: Payer: Medicare PPO | Admitting: Anesthesiology

## 2021-03-17 ENCOUNTER — Other Ambulatory Visit: Payer: Self-pay

## 2021-03-17 ENCOUNTER — Encounter
Admission: RE | Disposition: A | Discharge status: Home / Self Care | Payer: Self-pay | Source: Home / Self Care | Attending: Internal Medicine

## 2021-03-17 DIAGNOSIS — K449 Diaphragmatic hernia without obstruction or gangrene: Secondary | ICD-10-CM | POA: Insufficient documentation

## 2021-03-17 DIAGNOSIS — K219 Gastro-esophageal reflux disease without esophagitis: Secondary | ICD-10-CM | POA: Insufficient documentation

## 2021-03-17 DIAGNOSIS — Z8371 Family history of colonic polyps: Secondary | ICD-10-CM | POA: Diagnosis not present

## 2021-03-17 DIAGNOSIS — Z1211 Encounter for screening for malignant neoplasm of colon: Secondary | ICD-10-CM | POA: Diagnosis not present

## 2021-03-17 DIAGNOSIS — K640 First degree hemorrhoids: Secondary | ICD-10-CM | POA: Insufficient documentation

## 2021-03-17 DIAGNOSIS — Z7902 Long term (current) use of antithrombotics/antiplatelets: Secondary | ICD-10-CM | POA: Insufficient documentation

## 2021-03-17 DIAGNOSIS — Z79899 Other long term (current) drug therapy: Secondary | ICD-10-CM | POA: Diagnosis not present

## 2021-03-17 DIAGNOSIS — K222 Esophageal obstruction: Secondary | ICD-10-CM | POA: Insufficient documentation

## 2021-03-17 DIAGNOSIS — D120 Benign neoplasm of cecum: Secondary | ICD-10-CM | POA: Insufficient documentation

## 2021-03-17 HISTORY — PX: COLONOSCOPY WITH PROPOFOL: SHX5780

## 2021-03-17 HISTORY — DX: Hyperlipidemia, unspecified: E78.5

## 2021-03-17 HISTORY — DX: Other specified disorders of bone density and structure, unspecified site: M85.80

## 2021-03-17 HISTORY — PX: ESOPHAGOGASTRODUODENOSCOPY: SHX5428

## 2021-03-17 SURGERY — EGD (ESOPHAGOGASTRODUODENOSCOPY)
Anesthesia: General

## 2021-03-17 MED ORDER — PROPOFOL 10 MG/ML IV BOLUS
INTRAVENOUS | Status: DC | PRN
  Administered 2021-03-17: 50 mg via INTRAVENOUS

## 2021-03-17 MED ORDER — PROPOFOL 500 MG/50ML IV EMUL
INTRAVENOUS | Status: AC
  Filled 2021-03-17: qty 50

## 2021-03-17 MED ORDER — LIDOCAINE HCL (CARDIAC) PF 100 MG/5ML IV SOSY
PREFILLED_SYRINGE | INTRAVENOUS | Status: DC | PRN
  Administered 2021-03-17: 100 mg via INTRAVENOUS

## 2021-03-17 MED ORDER — SODIUM CHLORIDE 0.9 % IV SOLN
INTRAVENOUS | Status: DC
  Administered 2021-03-17: 20 mL/h via INTRAVENOUS

## 2021-03-17 MED ORDER — PROPOFOL 500 MG/50ML IV EMUL
INTRAVENOUS | Status: DC | PRN
  Administered 2021-03-17: 125 ug/kg/min via INTRAVENOUS

## 2021-03-17 NOTE — Op Note (Signed)
[facility] Patient Name: Morgan Ryan Procedure Date: 03/17/2021 10:30 AM MRN: [id number] Account #: [id number] Date of Birth: 1951/11/13 Admit Type: Outpatient Age: 70 Room: [facility] ENDO ROOM 2 Gender: Female Note Status: Finalized Procedure:             Colonoscopy Indications:           Colon cancer screening in patient at increased risk:                         Family history of 1st-degree relative with colon polyps Providers:             Benay Pike. Alice Reichert MD, MD Referring MD:          Ocie Cornfield. Ouida Sills MD, MD (Referring MD) Medicines:             Propofol per Anesthesia Complications:         No immediate complications. Procedure:             Pre-Anesthesia Assessment:                        - The risks and benefits of the procedure and the                         sedation options and risks were discussed with the                         patient. All questions were answered and informed                         consent was obtained.                        - Patient identification and proposed procedure were                         verified prior to the procedure by the nurse. The                         procedure was verified in the procedure room.                        - ASA Grade Assessment: III - A patient with severe                         systemic disease.                        - After reviewing the risks and benefits, the patient                         was deemed in satisfactory condition to undergo the                         procedure.                        After obtaining informed consent, the colonoscope was  passed under direct vision. Throughout the procedure,                         the patient's blood pressure, pulse, and oxygen                         saturations were monitored continuously. The                         Colonoscope was introduced through the anus and                         advanced  to the the cecum, identified by appendiceal                         orifice and ileocecal valve. The patient tolerated the                         procedure well. The quality of the bowel preparation                         was good. The ileocecal valve, appendiceal orifice,                         and rectum were photographed. The colonoscopy was                         somewhat difficult due to restricted mobility of the                         colon. Successful completion of the procedure was                         aided by applying abdominal pressure. Findings:      The perianal and digital rectal examinations were normal. Pertinent       negatives include normal sphincter tone and no palpable rectal lesions.      Non-bleeding internal hemorrhoids were found during retroflexion. The       hemorrhoids were Grade I (internal hemorrhoids that do not prolapse).      A 3 mm polyp was found in the cecum. The polyp was sessile. The polyp       was removed with a cold biopsy forceps. Resection and retrieval were       complete.      The exam was otherwise without abnormality. Impression:            - Non-bleeding internal hemorrhoids.                        - One 3 mm polyp in the cecum, removed with a cold                         biopsy forceps. Resected and retrieved.                        - The examination was otherwise normal. Recommendation:        - Patient has a contact number available for  emergencies. The signs and symptoms of potential                         delayed complications were discussed with the patient.                         Return to normal activities tomorrow. Written                         discharge instructions were provided to the patient.                        - Monitor results to esophageal dilation                        - Use Prilosec (omeprazole) 40 mg PO daily.                        - Repeat colonoscopy is recommended for  surveillance.                         The colonoscopy date will be determined after                         pathology results from today's exam become available                         for review.                        - Return to GI office in 6 weeks.                        - Follow up with Laurine Blazer, PA-C at [facility]. [phone].                        - The findings and recommendations were discussed with                         the patient. Procedure Code(s):     --- Professional ---                        [id number], Colonoscopy, flexible; with biopsy, single or                         multiple Diagnosis Code(s):     --- Professional ---                        K64.0, First degree hemorrhoids                        K63.5, Polyp of colon                        Z83.71, Family history of colonic polyps CPT copyright 2019 American Medical Association. All rights reserved. The codes documented in this report are preliminary and  upon coder review may  be revised to meet current compliance requirements. Efrain Sella MD, MD 03/17/2021 11:21:48 AM This report has been signed electronically. Number of Addenda: 0 Note Initiated On: 03/17/2021 10:30 AM Scope Withdrawal Time: 0 hours 6 minutes 8 seconds  Total Procedure Duration: 0 hours 14 minutes 31 seconds  Estimated Blood Loss:  Estimated blood loss: none.      [facility]

## 2021-03-17 NOTE — H&P (Signed)
Outpatient short stay form Pre-procedure 03/17/2021 10:25 AM Morgan Ryan K. Alice Reichert, M.D.  Primary Physician: Frazier Richards, M.D.  Reason for visit:  Dysphagia, hx esophageal stricture, colon cancer screening, family history of colon polyps  History of present illness:   70 year old patient presenting for family history of colon cancer. Patient denies any change in bowel habits, rectal bleeding or involuntary weight loss. Has intermittent dysphagia to solids, hx of EGD with dilation of stricture in 2019.    Current Facility-Administered Medications:  .  0.9 %  sodium chloride infusion, , Intravenous, Continuous, [place], Benay Pike, MD, Last Rate: 20 mL/hr at 03/17/21 0904, 20 mL/hr at 03/17/21 0904  Medications Prior to Admission  Medication Sig Dispense Refill Last Dose  . atorvastatin (LIPITOR) 40 MG tablet Take 40 mg by mouth at bedtime.    03/16/2021 at Unknown time  . Biotin 1000 MCG CHEW Chew 1,000 mg by mouth daily.    03/16/2021 at Unknown time  . clopidogrel (PLAVIX) 75 MG tablet Take 75 mg by mouth daily.   Past Week at Unknown time  . esomeprazole (NEXIUM) 20 MG capsule Take 20 mg by mouth daily at 12 noon.   03/16/2021 at Unknown time  . ezetimibe (ZETIA) 10 MG tablet Take 10 mg by mouth daily.    03/16/2021 at Unknown time  . Glycerin-Hypromellose-PEG 400 (DRY EYE RELIEF DROPS) 0.2-0.2-1 % SOLN Place 1 drop into both eyes daily as needed (Dry eye).   03/16/2021 at Unknown time  . propranolol (INDERAL) 20 MG tablet Take 20 mg by mouth 2 (two) times daily.   03/17/2021 at 0600  . raloxifene (EVISTA) 60 MG tablet Take 60 mg by mouth daily.   03/16/2021 at Unknown time     Allergies  Allergen Reactions  . Crestor [Rosuvastatin Calcium] Other (See Comments)    Body pain  . Naproxen Nausea And Vomiting     Past Medical History:  Diagnosis Date  . Alopecia   . Chronic kidney disease    history nephrolithiasis, renal artery stenosis  . Coronary artery disease   . History of hiatal hernia    . Hyperlipidemia   . Hyperparathyroidism ([place])    total calcium highest 12.2in 2/05, PTH 67. b. Sestamibi 6/04 revealed no adenoma osteoporosis with t score of -2.67 in femoral neck and   . Hypertension   . Osteopenia   . Osteoporosis   . Osteoporosis   . Renal artery stenosis ([place])   . Wears dentures    full upper, partial lower    Review of systems:  Otherwise negative.    Physical Exam  Gen: Alert, oriented. Appears stated age.  HEENT: [place]/AT. PERRLA. Lungs: CTA, no wheezes. CV: RR nl S1, S2. Abd: soft, benign, no masses. BS+ Ext: No edema. Pulses 2+    Planned procedures: Proceed with EGD and colonoscopy. The patient understands the nature of the planned procedure, indications, risks, alternatives and potential complications including but not limited to bleeding, infection, perforation, damage to internal organs and possible oversedation/side effects from anesthesia. The patient agrees and gives consent to proceed.  Please refer to procedure notes for findings, recommendations and patient disposition/instructions.     Akeel Reffner K. Alice Reichert, M.D. Gastroenterology 03/17/2021  10:25 AM

## 2021-03-17 NOTE — Interval H&P Note (Signed)
History and Physical Interval Note:  03/17/2021 10:28 AM  Morgan Ryan  has presented today for surgery, with the diagnosis of FAMILY HX.OF [place].  The various methods of treatment have been discussed with the patient and family. After consideration of risks, benefits and other options for treatment, the patient has consented to  Procedure(s): ESOPHAGOGASTRODUODENOSCOPY (EGD) (N/A) COLONOSCOPY WITH PROPOFOL (N/A) as a surgical intervention.  The patient's history has been reviewed, patient examined, no change in status, stable for surgery.  I have reviewed the patient's chart and labs.  Questions were answered to the patient's satisfaction.     [place], [place]

## 2021-03-17 NOTE — Anesthesia Postprocedure Evaluation (Signed)
Anesthesia Post Note  Patient: Morgan Ryan  Procedure(s) Performed: ESOPHAGOGASTRODUODENOSCOPY (EGD) (N/A ) COLONOSCOPY WITH PROPOFOL (N/A )  Patient location during evaluation: PACU Anesthesia Type: General Level of consciousness: awake and alert Pain management: pain level controlled Vital Signs Assessment: post-procedure vital signs reviewed and stable Respiratory status: spontaneous breathing, nonlabored ventilation and respiratory function stable Cardiovascular status: blood pressure returned to baseline and stable Postop Assessment: no apparent nausea or vomiting Anesthetic complications: no   No complications documented.   Last Vitals:  Vitals:   03/17/21 1137 03/17/21 1147  BP: 123/65 (!) 141/78  Pulse: 67 64  Resp: 17 14  Temp:    SpO2: 100% 100%    Last Pain:  Vitals:   03/17/21 1147  TempSrc:   PainSc: 0-No pain                 Brett Canales Jasime Westergren

## 2021-03-17 NOTE — Op Note (Signed)
[facility] Patient Name: Jouri Threat Procedure Date: 03/17/2021 10:31 AM MRN: [id number] Account #: [id number] Date of Birth: 04-25-1951 Admit Type: Outpatient Age: 70 Room: [facility] ENDO ROOM 2 Gender: Female Note Status: Finalized Procedure:             Upper GI endoscopy Indications:           Esophageal reflux, Follow-up of esophageal stenosis Providers:             Benay Pike. Alice Reichert MD, MD Referring MD:          Ocie Cornfield. Ouida Sills MD, MD (Referring MD) Medicines:             Propofol per Anesthesia Complications:         No immediate complications. Procedure:             Pre-Anesthesia Assessment:                        - Prior to the procedure, a History and Physical was                         performed, and patient medications, allergies and                         sensitivities were reviewed. The patient's tolerance                         of previous anesthesia was reviewed.                        - The risks and benefits of the procedure and the                         sedation options and risks were discussed with the                         patient. All questions were answered and informed                         consent was obtained.                        After obtaining informed consent, the endoscope was                         passed under direct vision. Throughout the procedure,                         the patient's blood pressure, pulse, and oxygen                         saturations were monitored continuously. The Endoscope                         was introduced through the mouth, and advanced to the                         third part of duodenum. The upper GI endoscopy was  somewhat difficult due to stricture. Successful                         completion of the procedure was aided by performing                         the maneuvers documented (below) in this report. The                         patient  tolerated the procedure well. Findings:      One benign-appearing, intrinsic severe (stenosis; an endoscope cannot       pass) stenosis was found at the gastroesophageal junction. This stenosis       measured 9 mm (inner diameter) x less than one cm (in length). The       stenosis was traversed after dilation. A TTS dilator was passed through       the scope. Dilation with a 12-13.5-15 mm balloon dilator was performed       to 15 mm. The dilation site was examined following endoscope reinsertion       and showed moderate improvement in luminal narrowing. Estimated blood       loss was minimal.      A 2 cm hiatal hernia was present.      The examined duodenum was normal.      The exam was otherwise without abnormality. Impression:            - Benign-appearing esophageal stenosis. Dilated.                        - 2 cm hiatal hernia.                        - Normal examined duodenum.                        - The examination was otherwise normal.                        - No specimens collected. Recommendation:        - Monitor results to esophageal dilation                        - Proceed with colonoscopy                        - Proceed with colonoscopy Procedure Code(s):     --- Professional ---                        [phone], Esophagogastroduodenoscopy, flexible,                         transoral; with transendoscopic balloon dilation of                         esophagus (less than 30 mm diameter) Diagnosis Code(s):     --- Professional ---                        K21.9, Gastro-esophageal reflux disease without  esophagitis                        K44.9, Diaphragmatic hernia without obstruction or                         gangrene                        K22.2, Esophageal obstruction CPT copyright 2019 American Medical Association. All rights reserved. The codes documented in this report are preliminary and upon coder review may  be revised to meet current  compliance requirements. Efrain Sella MD, MD 03/17/2021 10:56:34 AM This report has been signed electronically. Number of Addenda: 0 Note Initiated On: 03/17/2021 10:31 AM Estimated Blood Loss:  Estimated blood loss: none. Estimated blood loss was                         minimal.      [facility]

## 2021-03-17 NOTE — Anesthesia Preprocedure Evaluation (Signed)
Anesthesia Evaluation  Patient identified by MRN, date of birth, ID band Patient awake    Reviewed: Allergy & Precautions, NPO status , Patient's Chart, lab work & pertinent test results  History of Anesthesia Complications Negative for: history of anesthetic complications  Airway Mallampati: II       Dental  (+) Upper Dentures, Partial Lower   Pulmonary neg sleep apnea, neg COPD, Not current smoker,           Cardiovascular hypertension, Pt. on medications + Cardiac Stents and + CABG  (-) Past MI and (-) CHF (-) dysrhythmias (-) Valvular Problems/Murmurs     Neuro/Psych neg Seizures    GI/Hepatic Neg liver ROS, hiatal hernia, GERD  Medicated and Controlled,  Endo/Other  neg diabetes  Renal/GU Renal InsufficiencyRenal disease (s/p renal artery stents)     Musculoskeletal   Abdominal   Peds  Hematology   Anesthesia Other Findings   Reproductive/Obstetrics                             Anesthesia Physical Anesthesia Plan  ASA: III  Anesthesia Plan: General   Post-op Pain Management:    Induction: Intravenous  PONV Risk Score and Plan: 3 and Propofol infusion, TIVA and Treatment may vary due to age or medical condition  Airway Management Planned: Nasal Cannula  Additional Equipment:   Intra-op Plan:   Post-operative Plan:   Informed Consent: I have reviewed the patients History and Physical, chart, labs and discussed the procedure including the risks, benefits and alternatives for the proposed anesthesia with the patient or authorized representative who has indicated his/her understanding and acceptance.       Plan Discussed with:   Anesthesia Plan Comments:         Anesthesia Quick Evaluation

## 2021-03-17 NOTE — Transfer of Care (Signed)
Immediate Anesthesia Transfer of Care Note  Patient: Morgan Ryan  Procedure(s) Performed: ESOPHAGOGASTRODUODENOSCOPY (EGD) (N/A ) COLONOSCOPY WITH PROPOFOL (N/A )  Patient Location: PACU  Anesthesia Type:General  Level of Consciousness: awake and alert   Airway & Oxygen Therapy: Patient Spontanous Breathing  Post-op Assessment: Report given to RN and Post -op Vital signs reviewed and stable  Post vital signs: stable  Last Vitals:  Vitals Value Taken Time  BP    Temp    Pulse 69 03/17/21 1117  Resp 19 03/17/21 1117  SpO2 100 % 03/17/21 1117  Vitals shown include unvalidated device data.  Last Pain:  Vitals:   03/17/21 0856  TempSrc: Temporal         Complications: No complications documented.

## 2021-03-18 ENCOUNTER — Encounter: Payer: Self-pay | Admitting: Internal Medicine

## 2021-03-18 LAB — SURGICAL PATHOLOGY

## 2022-01-17 ENCOUNTER — Encounter (INDEPENDENT_AMBULATORY_CARE_PROVIDER_SITE_OTHER): Payer: Self-pay | Admitting: Vascular Surgery

## 2022-01-17 ENCOUNTER — Other Ambulatory Visit: Payer: Self-pay

## 2022-01-17 ENCOUNTER — Ambulatory Visit (INDEPENDENT_AMBULATORY_CARE_PROVIDER_SITE_OTHER): Payer: Medicare PPO

## 2022-01-17 ENCOUNTER — Ambulatory Visit (INDEPENDENT_AMBULATORY_CARE_PROVIDER_SITE_OTHER): Payer: Medicare PPO | Admitting: Vascular Surgery

## 2022-01-17 VITALS — BP 98/66 | HR 45 | Ht 63.0 in | Wt 105.0 lb

## 2022-01-17 DIAGNOSIS — I701 Atherosclerosis of renal artery: Secondary | ICD-10-CM

## 2022-01-17 DIAGNOSIS — I10 Essential (primary) hypertension: Secondary | ICD-10-CM

## 2022-01-17 DIAGNOSIS — I739 Peripheral vascular disease, unspecified: Secondary | ICD-10-CM | POA: Diagnosis not present

## 2022-01-17 DIAGNOSIS — I771 Stricture of artery: Secondary | ICD-10-CM | POA: Insufficient documentation

## 2022-01-17 DIAGNOSIS — E782 Mixed hyperlipidemia: Secondary | ICD-10-CM

## 2022-01-17 DIAGNOSIS — I25708 Atherosclerosis of coronary artery bypass graft(s), unspecified, with other forms of angina pectoris: Secondary | ICD-10-CM | POA: Diagnosis not present

## 2022-01-17 NOTE — Progress Notes (Signed)
MRN : [id number]  Morgan Ryan is a 71 y.o. (06-Sep-1951) female who presents with chief complaint of check kidney artery.  History of Present Illness:   The patient returns to the office for followup s/p repeat right renal artery stenting for recurrent renal artery stenosis. There have been no interval changes in the patient's blood pressure control.  She denies any major changes in her medications.  The patient denies headache or flushing.  No flank or unusual back pain.    Procedures: 05/12/2020  Stent right renal artery for recurrence  06/16/2020  Stent left renal artery for recurrence    There have been no significant changes to the patient's overall health care.   No interval shortening of the patient's walking distance or new symptoms consistent with claudication.  The patient denies the  development of rest pain symptoms. No new ulcers or wounds have occurred since the last visit.   The patient denies amaurosis fugax or recent TIA symptoms. There are no recent neurological changes noted. The patient denies history of DVT, PE or superficial thrombophlebitis. The patient denies recent episodes of angina or shortness of breath.   Duplex ultrasound of the renal arteries obtained today demonstrates widely patent bilateral renal arteries no evidence of in-stent restenosis.  Kidneys remain normal size and long axis.  Duplex ultrasound of the left upper extremity demonstrates the subclavian stent appears to be widely patent with normal flow pattern.  No outpatient medications have been marked as taking for the 01/17/22 encounter (Appointment) with Delana Meyer, Dolores Lory, MD.    Past Medical History:  Diagnosis Date   Alopecia    Chronic kidney disease    history nephrolithiasis, renal artery stenosis   Coronary artery disease    History of hiatal hernia    Hyperlipidemia    Hyperparathyroidism ([place])    total calcium highest 12.2in 2/05, PTH 67. b. Sestamibi 6/04 revealed no adenoma  osteoporosis with t score of -2.67 in femoral neck and    Hypertension    Osteopenia    Osteoporosis    Osteoporosis    Renal artery stenosis (HCC)    Wears dentures    full upper, partial lower    Past Surgical History:  Procedure Laterality Date   APPENDECTOMY     CATARACT EXTRACTION W/PHACO Right 02/16/2021   Procedure: CATARACT EXTRACTION PHACO AND INTRAOCULAR LENS PLACEMENT (IOC) RIGHT 3.84 00:23.8;  Surgeon: Birder Robson, MD;  Location: [place];  Service: Ophthalmology;  Laterality: Right;   CATARACT EXTRACTION W/PHACO Left 03/02/2021   Procedure: CATARACT EXTRACTION PHACO AND INTRAOCULAR LENS PLACEMENT (IOC) LEFT 5.64 00:33.9;  Surgeon: Birder Robson, MD;  Location: [place];  Service: Ophthalmology;  Laterality: Left;   COLONOSCOPY WITH PROPOFOL N/A 03/17/2021   Procedure: COLONOSCOPY WITH PROPOFOL;  Surgeon: Toledo, Benay Pike, MD;  Location: [facility];  Service: Gastroenterology;  Laterality: N/A;   CORONARY ARTERY BYPASS GRAFT     ESOPHAGOGASTRODUODENOSCOPY N/A 03/17/2021   Procedure: ESOPHAGOGASTRODUODENOSCOPY (EGD);  Surgeon: Toledo, Benay Pike, MD;  Location: [facility];  Service: Gastroenterology;  Laterality: N/A;   ESOPHAGOGASTRODUODENOSCOPY (EGD) WITH PROPOFOL N/A 03/07/2018   Procedure: ESOPHAGOGASTRODUODENOSCOPY (EGD) WITH PROPOFOL;  Surgeon: Toledo, Benay Pike, MD;  Location: [facility];  Service: Gastroenterology;  Laterality: N/A;   PARATHYROIDECTOMY     particial    RENAL ANGIOGRAPHY Right 05/12/2020   Procedure: RENAL ANGIOGRAPHY;  Surgeon: Katha Cabal, MD;  Location: [place] [facility];  Service: Cardiovascular;  Laterality: Right;   RENAL  ANGIOGRAPHY Left 06/16/2020   Procedure: RENAL ANGIOGRAPHY;  Surgeon: Katha Cabal, MD;  Location: [place] [facility];  Service: Cardiovascular;  Laterality: Left;   STENT PLACEMENT VASCULAR ([facility] HX)     TONSILLECTOMY      Social History Social History   Tobacco Use    Smoking status: Never   Smokeless tobacco: Never  Vaping Use   Vaping Use: Never used  Substance Use Topics   Alcohol use: Never   Drug use: Never    Family History Family History  Problem Relation Age of Onset   Heart disease Mother    Hypertension Father    Hyperlipidemia Father    Cancer Father    Diabetes Father    Heart attack Father    Heart disease Paternal Grandmother     Allergies  Allergen Reactions   Crestor [Rosuvastatin Calcium] Other (See Comments)    Body pain   Naproxen Nausea And Vomiting     REVIEW OF SYSTEMS (Negative unless checked)  Constitutional: [] Weight loss  [] Fever  [] Chills Cardiac: [] Chest pain   [] Chest pressure   [] Palpitations   [] Shortness of breath when laying flat   [] Shortness of breath with exertion. Vascular:  [] Pain in legs with walking   [] Pain in legs at rest  [] History of DVT   [] Phlebitis   [] Swelling in legs   [] Varicose veins   [] Non-healing ulcers Pulmonary:   [] Uses home oxygen   [] Productive cough   [] Hemoptysis   [] Wheeze  [] COPD   [] Asthma Neurologic:  [] Dizziness   [] Seizures   [] History of stroke   [] History of TIA  [] Aphasia   [] Vissual changes   [] Weakness or numbness in arm   [] Weakness or numbness in leg Musculoskeletal:   [] Joint swelling   [] Joint pain   [] Low back pain Hematologic:  [] Easy bruising  [] Easy bleeding   [] Hypercoagulable state   [] Anemic Gastrointestinal:  [] Diarrhea   [] Vomiting  [] Gastroesophageal reflux/heartburn   [] Difficulty swallowing. Genitourinary:  [] Chronic kidney disease   [] Difficult urination  [] Frequent urination   [] Blood in urine Skin:  [] Rashes   [] Ulcers  Psychological:  [] History of anxiety   []  History of major depression.  Physical Examination  There were no vitals filed for this visit. There is no height or weight on file to calculate BMI. Gen: WD/WN, NAD Head: [place]/AT, No temporalis wasting.  Ear/Nose/Throat: Hearing grossly intact, nares w/o erythema or drainage Eyes: PER,  EOMI, sclera nonicteric.  Neck: Supple, no masses.  No bruit or JVD.  Pulmonary:  Good air movement, no audible wheezing, no use of accessory muscles.  Cardiac: RRR, normal S1, S2, no Murmurs. Vascular:   Vessel Right Left  Radial Palpable Palpable  Gastrointestinal: soft, non-distended. No guarding/no peritoneal signs.  Musculoskeletal: M/S 5/5 throughout.  No visible deformity.  Neurologic: CN 2-12 intact. Pain and light touch intact in extremities.  Symmetrical.  Speech is fluent. Motor exam as listed above. Psychiatric: Judgment intact, Mood & affect appropriate for pt's clinical situation. Dermatologic: No rashes or ulcers noted.  No changes consistent with cellulitis.   CBC No results found for: WBC, HGB, HCT, MCV, PLT  BMET    Component Value Date/Time   BUN 21 06/16/2020 0819   CREATININE 1.01 (H) 06/16/2020 0819   GFRNONAA 58 (L) 06/16/2020 0819   GFRAA >60 06/16/2020 0819   CrCl cannot be calculated (Patient's most recent lab result is older than the maximum 21 days allowed.).  COAG No results found for: INR, PROTIME  Radiology No results found.   Assessment/Plan 1. Renal artery stenosis (HCC) BP today was acceptable with systolic reading 98 and diastolic reading 60.   Given that optimal control of the patient's hypertension is important to minimize the risk of heart attack and/or CVA.   The patient's BP and noninvasive studies suggest bilateral renal interventions are widely patent.   The patient will continue the current medications.   The primary medical service will continue aggressive antihypertensive therapy as per the AHA guidelines  - VAS US RENAL ARTERY DUPLEX; Future  2. Subclavian artery stenosis (HCC) Doing well   Intervention is patent  - VAS [place] UPPER EXTREMITY ARTERIAL DUPLEX; Future  3. PAD (peripheral artery disease) (HCC) Recommend:   The patient has evidence of atherosclerosis of the lower extremities with claudication.  The patient  does not voice lifestyle limiting changes at this point in time.  Similarly, her arm remains asymptomatic.   Noninvasive studies do not suggest clinically significant change.   No invasive studies, angiography or surgery at this time The patient should continue walking and begin a more formal exercise program.  The patient should continue antiplatelet therapy and aggressive treatment of the lipid abnormalities   No changes in the patient's medications at this time  4. Coronary artery disease of bypass graft of native heart with stable angina pectoris (HCC) Continue cardiac and antihypertensive medications as already ordered and reviewed, no changes at this time.  Continue statin as ordered and reviewed, no changes at this time  Nitrates PRN for chest pain   5. HTN (hypertension), benign Continue antihypertensive medications as already ordered, these medications have been reviewed and there are no changes at this time.   6. Mixed hyperlipidemia Continue statin as ordered and reviewed, no changes at this time     Hortencia Pilar, MD  01/17/2022 8:14 AM

## 2022-09-15 ENCOUNTER — Other Ambulatory Visit: Payer: Self-pay | Admitting: Internal Medicine

## 2022-09-15 DIAGNOSIS — Z1231 Encounter for screening mammogram for malignant neoplasm of breast: Secondary | ICD-10-CM

## 2022-10-10 ENCOUNTER — Encounter (INDEPENDENT_AMBULATORY_CARE_PROVIDER_SITE_OTHER): Payer: Self-pay

## 2022-10-20 ENCOUNTER — Ambulatory Visit
Admission: RE | Admit: 2022-10-20 | Discharge: 2022-10-20 | Disposition: A | Discharge status: Home / Self Care | Payer: Medicare PPO | Source: Ambulatory Visit | Attending: Internal Medicine | Admitting: Internal Medicine

## 2022-10-20 DIAGNOSIS — Z1231 Encounter for screening mammogram for malignant neoplasm of breast: Secondary | ICD-10-CM | POA: Diagnosis present

## 2023-01-17 ENCOUNTER — Other Ambulatory Visit (INDEPENDENT_AMBULATORY_CARE_PROVIDER_SITE_OTHER): Payer: Self-pay | Admitting: Vascular Surgery

## 2023-01-17 DIAGNOSIS — I771 Stricture of artery: Secondary | ICD-10-CM

## 2023-01-17 DIAGNOSIS — I701 Atherosclerosis of renal artery: Secondary | ICD-10-CM

## 2023-01-19 ENCOUNTER — Ambulatory Visit (INDEPENDENT_AMBULATORY_CARE_PROVIDER_SITE_OTHER): Payer: Medicare PPO | Admitting: Vascular Surgery

## 2023-01-19 ENCOUNTER — Ambulatory Visit (INDEPENDENT_AMBULATORY_CARE_PROVIDER_SITE_OTHER): Payer: Medicare PPO

## 2023-01-19 DIAGNOSIS — I771 Stricture of artery: Secondary | ICD-10-CM | POA: Diagnosis not present

## 2023-01-19 DIAGNOSIS — I701 Atherosclerosis of renal artery: Secondary | ICD-10-CM

## 2023-04-04 ENCOUNTER — Encounter: Payer: Self-pay | Admitting: Internal Medicine

## 2023-04-05 ENCOUNTER — Ambulatory Visit: Payer: Medicare PPO | Admitting: Anesthesiology

## 2023-04-05 ENCOUNTER — Ambulatory Visit
Admission: RE | Admit: 2023-04-05 | Discharge: 2023-04-05 | Disposition: A | Discharge status: Home / Self Care | Payer: Medicare PPO | Attending: Internal Medicine | Admitting: Internal Medicine

## 2023-04-05 ENCOUNTER — Encounter: Payer: Self-pay | Admitting: Internal Medicine

## 2023-04-05 ENCOUNTER — Encounter
Admission: RE | Disposition: A | Discharge status: Home / Self Care | Payer: Self-pay | Source: Home / Self Care | Attending: Internal Medicine

## 2023-04-05 DIAGNOSIS — I10 Essential (primary) hypertension: Secondary | ICD-10-CM | POA: Diagnosis not present

## 2023-04-05 DIAGNOSIS — E785 Hyperlipidemia, unspecified: Secondary | ICD-10-CM | POA: Insufficient documentation

## 2023-04-05 DIAGNOSIS — Z951 Presence of aortocoronary bypass graft: Secondary | ICD-10-CM | POA: Diagnosis not present

## 2023-04-05 DIAGNOSIS — K449 Diaphragmatic hernia without obstruction or gangrene: Secondary | ICD-10-CM | POA: Insufficient documentation

## 2023-04-05 DIAGNOSIS — R1310 Dysphagia, unspecified: Secondary | ICD-10-CM | POA: Insufficient documentation

## 2023-04-05 DIAGNOSIS — Z79899 Other long term (current) drug therapy: Secondary | ICD-10-CM | POA: Diagnosis not present

## 2023-04-05 DIAGNOSIS — K5900 Constipation, unspecified: Secondary | ICD-10-CM | POA: Insufficient documentation

## 2023-04-05 DIAGNOSIS — I739 Peripheral vascular disease, unspecified: Secondary | ICD-10-CM | POA: Diagnosis not present

## 2023-04-05 DIAGNOSIS — K222 Esophageal obstruction: Secondary | ICD-10-CM | POA: Insufficient documentation

## 2023-04-05 DIAGNOSIS — K219 Gastro-esophageal reflux disease without esophagitis: Secondary | ICD-10-CM | POA: Insufficient documentation

## 2023-04-05 DIAGNOSIS — I2510 Atherosclerotic heart disease of native coronary artery without angina pectoris: Secondary | ICD-10-CM | POA: Diagnosis not present

## 2023-04-05 DIAGNOSIS — Z955 Presence of coronary angioplasty implant and graft: Secondary | ICD-10-CM | POA: Diagnosis not present

## 2023-04-05 HISTORY — DX: Gastro-esophageal reflux disease without esophagitis: K21.9

## 2023-04-05 HISTORY — PX: ESOPHAGOGASTRODUODENOSCOPY (EGD) WITH PROPOFOL: SHX5813

## 2023-04-05 SURGERY — ESOPHAGOGASTRODUODENOSCOPY (EGD) WITH PROPOFOL
Anesthesia: General

## 2023-04-05 MED ORDER — PROPOFOL 10 MG/ML IV BOLUS
INTRAVENOUS | Status: DC | PRN
  Administered 2023-04-05: 20 mg via INTRAVENOUS
  Administered 2023-04-05: 50 mg via INTRAVENOUS
  Administered 2023-04-05: 30 mg via INTRAVENOUS

## 2023-04-05 MED ORDER — SODIUM CHLORIDE 0.9 % IV SOLN: INTRAVENOUS | Status: DC

## 2023-04-05 MED ORDER — PROPOFOL 10 MG/ML IV BOLUS
INTRAVENOUS | Status: AC
  Filled 2023-04-05: qty 20

## 2023-04-05 NOTE — Anesthesia Postprocedure Evaluation (Signed)
Anesthesia Post Note  Patient: Morgan Ryan  Procedure(s) Performed: ESOPHAGOGASTRODUODENOSCOPY (EGD) WITH PROPOFOL  Patient location during evaluation: Endoscopy Anesthesia Type: General Level of consciousness: awake and alert Pain management: pain level controlled Vital Signs Assessment: post-procedure vital signs reviewed and stable Respiratory status: spontaneous breathing, nonlabored ventilation, respiratory function stable and patient connected to nasal cannula oxygen Cardiovascular status: blood pressure returned to baseline and stable Postop Assessment: no apparent nausea or vomiting Anesthetic complications: no   There were no known notable events for this encounter.   Last Vitals:  Vitals:   04/05/23 0938 04/05/23 0954  BP: 123/67 106/67  Pulse:    Resp:    Temp:    SpO2:  99%    Last Pain:  Vitals:   04/05/23 0954  TempSrc:   PainSc: 0-No pain                 Corinda Gubler

## 2023-04-05 NOTE — Op Note (Signed)
[facility] Patient Name: Morgan Ryan Procedure Date: 04/05/2023 9:09 AM MRN: [id number] Account #: [id number] Date of Birth: [birth year] Admit Type: Outpatient Age: 72 Room: [facility] ENDO ROOM 2 Gender: Female Note Status: Finalized Instrument Name: Upper Endoscope [id number] Procedure:             Upper GI endoscopy Indications:           Dysphagia, For therapy of esophageal stricture Providers:             Boykin Nearing. Norma Fredrickson MD, MD Referring MD:          Marya Amsler. Dareen Piano MD, MD (Referring MD) Medicines:             Propofol per Anesthesia Complications:         No immediate complications. Estimated blood loss:                         Minimal. Procedure:             Pre-Anesthesia Assessment:                        - The risks and benefits of the procedure and the                         sedation options and risks were discussed with the                         patient. All questions were answered and informed                         consent was obtained.                        - Patient identification and proposed procedure were                         verified prior to the procedure by the nurse. The                         procedure was verified in the procedure room.                        - ASA Grade Assessment: III - A patient with severe                         systemic disease.                        - After reviewing the risks and benefits, the patient                         was deemed in satisfactory condition to undergo the                         procedure.                        After obtaining informed consent, the endoscope was  passed under direct vision. Throughout the procedure,                         the patient's blood pressure, pulse, and oxygen                         saturations were monitored continuously. The Endoscope                         was introduced through the mouth, and advanced to the                          third part of duodenum. The upper GI endoscopy was                         somewhat difficult due to stricture. Successful                         completion of the procedure was aided by performing                         the maneuvers documented (below) in this report. The                         patient tolerated the procedure well. Findings:      One benign-appearing, intrinsic severe stenosis was found at the       gastroesophageal junction. This stenosis measured 1.1 cm (inner       diameter) x less than one cm (in length). The stenosis was traversed       after dilation. A TTS dilator was passed through the scope. Dilation       with a 12-13.5-15 mm balloon dilator was performed to 15 mm. The       dilation site was examined following endoscope reinsertion and showed       complete resolution of luminal narrowing. Estimated blood loss was       minimal.      No other significant abnormalities were identified in a careful       examination of the esophagus.      A 4 cm hiatal hernia was present.      The exam of the stomach was otherwise normal.      The examined duodenum was normal. Impression:            - Benign-appearing esophageal stenosis. Dilated.                        - 4 cm hiatal hernia.                        - Normal examined duodenum.                        - No specimens collected. Recommendation:        - Patient has a contact number available for                         emergencies. The signs and symptoms of potential  delayed complications were discussed with the patient.                         Return to normal activities tomorrow. Written                         discharge instructions were provided to the patient.                        - Resume previous diet.                        - Continue present medications.                        - Repeat upper endoscopy PRN for retreatment.                        - Return to  GI office in 3 months.                        - Telephone GI office to schedule appointment.                        - The findings and recommendations were discussed with                         the patient. Procedure Code(s):     --- Professional ---                        [phone], Esophagogastroduodenoscopy, flexible,                         transoral; with transendoscopic balloon dilation of                         esophagus (less than 30 mm diameter) Diagnosis Code(s):     --- Professional ---                        R13.10, Dysphagia, unspecified                        K44.9, Diaphragmatic hernia without obstruction or                         gangrene                        K22.2, Esophageal obstruction CPT copyright 2022 American Medical Association. All rights reserved. The codes documented in this report are preliminary and upon coder review may  be revised to meet current compliance requirements. Stanton Kidney MD, MD 04/05/2023 9:32:32 AM This report has been signed electronically. Number of Addenda: 0 Note Initiated On: 04/05/2023 9:09 AM Estimated Blood Loss:  Estimated blood loss was minimal.      [facility]

## 2023-04-05 NOTE — Transfer of Care (Signed)
Immediate Anesthesia Transfer of Care Note  Patient: Morgan Ryan  Procedure(s) Performed: ESOPHAGOGASTRODUODENOSCOPY (EGD) WITH PROPOFOL  Patient Location: PACU  Anesthesia Type:General  Level of Consciousness: awake and sedated  Airway & Oxygen Therapy: Patient Spontanous Breathing and Patient connected to nasal cannula oxygen  Post-op Assessment: Report given to RN and Post -op Vital signs reviewed and stable  Post vital signs: Reviewed and stable  Last Vitals:  Vitals Value Taken Time  BP    Temp    Pulse    Resp    SpO2      Last Pain:  Vitals:   04/05/23 0803  TempSrc: Temporal  PainSc: 0-No pain         Complications: There were no known notable events for this encounter.

## 2023-04-05 NOTE — Interval H&P Note (Signed)
History and Physical Interval Note:  04/05/2023 8:19 AM  Morgan Ryan Tagan Bartram  has presented today for surgery, with the diagnosis of 787.20 (ICD-9-CM) - R13.10 (ICD-10-CM) - Dysphagia, unspecified type V58.63 (ICD-9-CM) - Z79.02 (ICD-10-CM) - Antiplatelet or antithrombotic long-term use.  The various methods of treatment have been discussed with the patient and family. After consideration of risks, benefits and other options for treatment, the patient has consented to  Procedure(s): ESOPHAGOGASTRODUODENOSCOPY (EGD) WITH PROPOFOL (N/A) as a surgical intervention.  The patient's history has been reviewed, patient examined, no change in status, stable for surgery.  I have reviewed the patient's chart and labs.  Questions were answered to the patient's satisfaction.     [place], [address]

## 2023-04-05 NOTE — H&P (Signed)
Outpatient short stay form Pre-procedure 04/05/2023 8:18 AM Morgan Meckel K. Norma Fredrickson, M.D.  Primary Physician: Einar Crow, M.D.  Reason for visit:  Dysphagia, hx GERD, hx esophageal stricture  History of present illness:  Ms. Morgan Ryan reports having issues with worsening dysphagia over the past months to years. She reports significant dysphagia at this time, she avoids eating out due to the dysphagia. She feels that the most common triggers are foods such as breads, avoids a lot of meats due to dysphagia. She feels that often she will get foods lodged in her esophagus and can take hours to feel like they are going down. They can also be very painful when they are lodged. She has had issues drinking liquids when the foods were lodged in her esophagus. She denies any issues with feeling her GERD symptoms. She denies any nausea vomiting. No epigastric pain.  She has constipation & uses a daily suppository. With daily suppositories she reports a daily bowel movement. She denies any issues with diarrhea, melena or rectal bleeding, lower abdominal pain. She has not tried any other regimens over-the-counter.     Current Facility-Administered Medications:    0.9 %  sodium chloride infusion, , Intravenous, Continuous, Keria Widrig, Boykin Nearing, MD  Medications Prior to Admission  Medication Sig Dispense Refill Last Dose   propranolol (INDERAL) 20 MG tablet Take 20 mg by mouth 2 (two) times daily.   04/05/2023 at 0600   atorvastatin (LIPITOR) 40 MG tablet Take 40 mg by mouth at bedtime.       Biotin 1000 MCG CHEW Chew 1,000 mg by mouth daily.       clopidogrel (PLAVIX) 75 MG tablet Take 75 mg by mouth daily.   03/31/2023   esomeprazole (NEXIUM) 20 MG capsule Take 20 mg by mouth daily at 12 noon.      ezetimibe (ZETIA) 10 MG tablet Take 10 mg by mouth daily.       Glycerin-Hypromellose-PEG 400 (DRY EYE RELIEF DROPS) 0.2-0.2-1 % SOLN Place 1 drop into both eyes daily as needed (Dry eye).      raloxifene (EVISTA)  60 MG tablet Take 60 mg by mouth daily.        Allergies  Allergen Reactions   Crestor [Rosuvastatin Calcium] Other (See Comments)    Body pain   Naproxen Nausea And Vomiting     Past Medical History:  Diagnosis Date   Alopecia    Chronic kidney disease    history nephrolithiasis, renal artery stenosis   Coronary artery disease    GERD (gastroesophageal reflux disease)    History of hiatal hernia    Hyperlipidemia    Hyperparathyroidism    total calcium highest 12.2in 2/05, PTH 67. b. Sestamibi 6/04 revealed no adenoma osteoporosis with t score of -2.67 in femoral neck and    Hypertension    Osteopenia    Osteoporosis    Osteoporosis    Renal artery stenosis    Wears dentures    full upper, partial lower    Review of systems:  Otherwise negative.    Physical Exam  Gen: Alert, oriented. Appears stated age.  HEENT: [place]/AT. PERRLA. Lungs: CTA, no wheezes. CV: RR nl S1, S2. Abd: soft, benign, no masses. BS+ Ext: No edema. Pulses 2+    Planned procedures: Proceed with EGD. The patient understands the nature of the planned procedure, indications, risks, alternatives and potential complications including but not limited to bleeding, infection, perforation, damage to internal organs and possible oversedation/side effects from anesthesia. The patient  agrees and gives consent to proceed.  Please refer to procedure notes for findings, recommendations and patient disposition/instructions.     Farhad Burleson K. Norma Fredrickson, M.D. Gastroenterology 04/05/2023  8:18 AM

## 2023-04-05 NOTE — Anesthesia Preprocedure Evaluation (Signed)
Anesthesia Evaluation  Patient identified by MRN, date of birth, ID band Patient awake    Reviewed: Allergy & Precautions, NPO status , Patient's Chart, lab work & pertinent test results  History of Anesthesia Complications Negative for: history of anesthetic complications  Airway Mallampati: II  TM Distance: >3 FB Neck ROM: Full    Dental no notable dental hx. (+) Upper Dentures, Partial Lower   Pulmonary neg pulmonary ROS, neg sleep apnea, neg COPD, Patient abstained from smoking.Not current smoker   Pulmonary exam normal breath sounds clear to auscultation       Cardiovascular Exercise Tolerance: Good METShypertension, Pt. on medications + CAD, + Cardiac Stents, + CABG and + Peripheral Vascular Disease  (-) Past MI and (-) CHF (-) dysrhythmias (-) Valvular Problems/Murmurs Rhythm:Regular Rate:Normal - Systolic murmurs    Neuro/Psych neg Seizures negative neurological ROS  negative psych ROS   GI/Hepatic Neg liver ROS, hiatal hernia,GERD  Medicated and Controlled,,  Endo/Other  neg diabetes    Renal/GU Renal InsufficiencyRenal disease (s/p renal artery stents)negative Renal ROS     Musculoskeletal   Abdominal   Peds  Hematology   Anesthesia Other Findings Past Medical History: No date: Alopecia No date: Chronic kidney disease     Comment:  history nephrolithiasis, renal artery stenosis No date: Coronary artery disease No date: GERD (gastroesophageal reflux disease) No date: History of hiatal hernia No date: Hyperlipidemia No date: Hyperparathyroidism     Comment:  total calcium highest 12.2in 2/05, PTH 67. b. Sestamibi               6/04 revealed no adenoma osteoporosis with t score of               -2.67 in femoral neck and  No date: Hypertension No date: Osteopenia No date: Osteoporosis No date: Osteoporosis No date: Renal artery stenosis No date: Wears dentures     Comment:  full upper, partial  lower  Reproductive/Obstetrics                              Anesthesia Physical Anesthesia Plan  ASA: 3  Anesthesia Plan: General   Post-op Pain Management: Minimal or no pain anticipated   Induction: Intravenous  PONV Risk Score and Plan: 3 and Propofol infusion, TIVA and Treatment may vary due to age or medical condition  Airway Management Planned: Nasal Cannula  Additional Equipment: None  Intra-op Plan:   Post-operative Plan:   Informed Consent: I have reviewed the patients History and Physical, chart, labs and discussed the procedure including the risks, benefits and alternatives for the proposed anesthesia with the patient or authorized representative who has indicated his/her understanding and acceptance.     Dental advisory given  Plan Discussed with: CRNA and Surgeon  Anesthesia Plan Comments: (Discussed risks of anesthesia with patient, including possibility of difficulty with spontaneous ventilation under anesthesia necessitating airway intervention, PONV, and rare risks such as cardiac or respiratory or neurological events, and allergic reactions. Discussed the role of CRNA in patient's perioperative care. Patient understands.)         Anesthesia Quick Evaluation

## 2023-04-06 ENCOUNTER — Encounter: Payer: Self-pay | Admitting: Internal Medicine

## 2023-04-18 ENCOUNTER — Encounter: Payer: Self-pay | Admitting: Internal Medicine

## 2023-10-27 ENCOUNTER — Other Ambulatory Visit: Payer: Self-pay | Admitting: Internal Medicine

## 2023-10-27 DIAGNOSIS — Z1231 Encounter for screening mammogram for malignant neoplasm of breast: Secondary | ICD-10-CM

## 2023-11-21 ENCOUNTER — Ambulatory Visit
Admission: RE | Admit: 2023-11-21 | Discharge: 2023-11-21 | Disposition: A | Discharge status: Home / Self Care | Payer: Medicare PPO | Source: Ambulatory Visit | Attending: Internal Medicine | Admitting: Internal Medicine

## 2023-11-21 DIAGNOSIS — Z1231 Encounter for screening mammogram for malignant neoplasm of breast: Secondary | ICD-10-CM | POA: Insufficient documentation

## 2024-01-15 ENCOUNTER — Encounter (INDEPENDENT_AMBULATORY_CARE_PROVIDER_SITE_OTHER): Payer: Self-pay | Admitting: Vascular Surgery

## 2024-01-15 DIAGNOSIS — I701 Atherosclerosis of renal artery: Secondary | ICD-10-CM

## 2024-01-16 ENCOUNTER — Other Ambulatory Visit (INDEPENDENT_AMBULATORY_CARE_PROVIDER_SITE_OTHER): Payer: Self-pay | Admitting: Vascular Surgery

## 2024-01-16 DIAGNOSIS — I771 Stricture of artery: Secondary | ICD-10-CM

## 2024-01-16 DIAGNOSIS — I701 Atherosclerosis of renal artery: Secondary | ICD-10-CM

## 2024-01-16 NOTE — Progress Notes (Signed)
 MRN : [id number]  Morgan Ryan is a 73 y.o. (December 24, 1950) female who presents with chief complaint of check circulation.  History of Present Illness:   The patient returns to the office for followup s/p repeat right renal artery stenting for recurrent renal artery stenosis. There have been no interval changes in the patient's blood pressure control.  She denies any major changes in her medications.  The patient denies headache or flushing.  No flank or unusual back pain.  Patient is also followed for history of left subclavian artery stenosis.  Stent placement was many years ago.  Today she denies claudication of the left arm no vertebrobasilar symptoms.   Procedures: 05/12/2020  Stent right renal artery for recurrence  06/16/2020  Stent left renal artery for recurrence   Remote:    Left subclavian artery stent placement   There have been no significant changes to the patient's overall health care.   No interval shortening of the patient's walking distance or new symptoms consistent with claudication.  In fact the patient is walking approximately 5 miles a day which she keeps track of.  She states that last week she had a total of 40 miles.  The patient denies the  development of rest pain symptoms. No new ulcers or wounds have occurred since the last visit.  Patient denies left arm pain or claudication symptoms.  No vertebral basilar symptoms noted.  The patient denies amaurosis fugax or recent TIA symptoms. There are no recent neurological changes noted.  The patient denies history of DVT, PE or superficial thrombophlebitis. The patient denies recent episodes of angina or shortness of breath.    Duplex ultrasound of the renal arteries obtained today demonstrates widely patent bilateral renal arteries no evidence of in-stent restenosis.  Kidneys remain normal size in the long axis.  No change when compared to previous  study.  Duplex ultrasound of the left upper extremity obtained today demonstrates the subclavian stent appears to be widely patent with normal flow pattern.  No change when compared to previous study.    No outpatient medications have been marked as taking for the 01/18/24 encounter (Appointment) with Jama, Cordella MATSU, MD.    Past Medical History:  Diagnosis Date   Alopecia    Chronic kidney disease    history nephrolithiasis, renal artery stenosis   Coronary artery disease    GERD (gastroesophageal reflux disease)    History of hiatal hernia    Hyperlipidemia    Hyperparathyroidism (HCC)    total calcium highest 12.2in 2/05, PTH 67. b. Sestamibi 6/04 revealed no adenoma osteoporosis with t score of -2.67 in femoral neck and    Hypertension    Osteopenia    Osteoporosis    Osteoporosis    Renal artery stenosis (HCC)    Wears dentures    full upper, partial lower    Past Surgical History:  Procedure Laterality Date   APPENDECTOMY     CATARACT EXTRACTION W/PHACO Right 02/16/2021   Procedure: CATARACT EXTRACTION PHACO AND INTRAOCULAR LENS PLACEMENT (IOC) RIGHT 3.84 00:23.8;  Surgeon: [name], MD;  Location: [facility];  Service: Ophthalmology;  Laterality: Right;   CATARACT EXTRACTION W/PHACO Left 03/02/2021   Procedure: CATARACT EXTRACTION PHACO AND INTRAOCULAR LENS PLACEMENT (IOC) LEFT 5.64 00:33.9;  Surgeon: [name], MD;  Location: [facility];  Service: Ophthalmology;  Laterality: Left;   COLONOSCOPY WITH PROPOFOL  N/A 03/17/2021   Procedure: COLONOSCOPY WITH PROPOFOL ;  Surgeon: Toledo, [name], MD;  Location: [facility];  Service: Gastroenterology;  Laterality: N/A;   CORONARY ARTERY BYPASS GRAFT     ESOPHAGOGASTRODUODENOSCOPY N/A 03/17/2021   Procedure: ESOPHAGOGASTRODUODENOSCOPY (EGD);  Surgeon: Toledo, [name], MD;  Location: [facility];  Service: Gastroenterology;  Laterality: N/A;   ESOPHAGOGASTRODUODENOSCOPY (EGD) WITH PROPOFOL   N/A 03/07/2018   Procedure: ESOPHAGOGASTRODUODENOSCOPY (EGD) WITH PROPOFOL ;  Surgeon: Toledo, [name], MD;  Location: [facility];  Service: Gastroenterology;  Laterality: N/A;   ESOPHAGOGASTRODUODENOSCOPY (EGD) WITH PROPOFOL  N/A 04/05/2023   Procedure: ESOPHAGOGASTRODUODENOSCOPY (EGD) WITH PROPOFOL ;  Surgeon: Toledo, [name], MD;  Location: [facility];  Service: Gastroenterology;  Laterality: N/A;   PARATHYROIDECTOMY     particial    RENAL ANGIOGRAPHY Right 05/12/2020   Procedure: RENAL ANGIOGRAPHY;  Surgeon: [name], MD;  Location: [facility];  Service: Cardiovascular;  Laterality: Right;   RENAL ANGIOGRAPHY Left 06/16/2020   Procedure: RENAL ANGIOGRAPHY;  Surgeon: [name], MD;  Location: [facility];  Service: Cardiovascular;  Laterality: Left;   STENT PLACEMENT VASCULAR ([facility] HX)     TONSILLECTOMY      Social History Social History   Tobacco Use   Smoking status: Never   Smokeless tobacco: Never  Vaping Use   Vaping status: Never Used  Substance Use Topics   Alcohol use: Never   Drug use: Never    Family History Family History  Problem Relation Age of Onset   Heart disease Mother    Hypertension Father    Hyperlipidemia Father    Cancer Father    Diabetes Father    Heart attack Father    Heart disease Paternal Grandmother     Allergies  Allergen Reactions   Crestor [Rosuvastatin Calcium] Other (See Comments)    Body pain   Naproxen Nausea And Vomiting     REVIEW OF SYSTEMS (Negative unless checked)  Constitutional: [] Weight loss  [] Fever  [] Chills Cardiac: [] Chest pain   [] Chest pressure   [] Palpitations   [] Shortness of breath when laying flat   [] Shortness of breath with exertion. Vascular:  [x] Pain in legs with walking   [] Pain in legs at rest  [] History of DVT   [] Phlebitis   [] Swelling in legs   [] Varicose veins   [] Non-healing ulcers Pulmonary:   [] Uses home oxygen   [] Productive cough   [] Hemoptysis    [] Wheeze  [] COPD   [] Asthma Neurologic:  [] Dizziness   [] Seizures   [] History of stroke   [] History of TIA  [] Aphasia   [] Vissual changes   [] Weakness or numbness in arm   [] Weakness or numbness in leg Musculoskeletal:   [] Joint swelling   [] Joint pain   [] Low back pain Hematologic:  [] Easy bruising  [] Easy bleeding   [] Hypercoagulable state   [] Anemic Gastrointestinal:  [] Diarrhea   [] Vomiting  [] Gastroesophageal reflux/heartburn   [] Difficulty swallowing. Genitourinary:  [] Chronic kidney disease   [] Difficult urination  [] Frequent urination   [] Blood in urine Skin:  [] Rashes   [] Ulcers  Psychological:  [] History of anxiety   []  History of major depression.  Physical Examination  There were no vitals filed for this visit. There is  no height or weight on file to calculate BMI. Gen: WD/WN, NAD Head: [place]/AT, No temporalis wasting.  Ear/Nose/Throat: Hearing grossly intact, nares w/o erythema or drainage Eyes: PER, EOMI, sclera nonicteric.  Neck: Supple, no masses.  No bruit or JVD.  Pulmonary:  Good air movement, no audible wheezing, no use of accessory muscles.  Cardiac: RRR, normal S1, S2, no Murmurs. Vascular:  mild trophic changes, no open wounds Vessel Right Left  Radial Palpable Palpable  PT Palpable Palpable  DP Palpable  Palpable  Gastrointestinal: soft, non-distended. No guarding/no peritoneal signs.  Musculoskeletal: M/S 5/5 throughout.  No visible deformity.  Neurologic: CN 2-12 intact. Pain and light touch intact in extremities.  Symmetrical.  Speech is fluent. Motor exam as listed above. Psychiatric: Judgment intact, Mood & affect appropriate for pt's clinical situation. Dermatologic: No rashes or ulcers noted.  No changes consistent with cellulitis.   CBC No results found for: WBC, HGB, HCT, MCV, PLT  BMET    Component Value Date/Time   BUN 21 06/16/2020 0819   CREATININE 1.01 (H) 06/16/2020 0819   GFRNONAA 58 (L) 06/16/2020 0819   GFRAA >60 06/16/2020  0819   CrCl cannot be calculated (Patient's most recent lab result is older than the maximum 21 days allowed.).  COAG No results found for: INR, PROTIME  Radiology No results found.   Assessment/Plan 1. Renal artery stenosis (HCC) (Primary) BP today was acceptable with systolic reading 886 and diastolic reading 58.   Given that optimal control of the patient's hypertension is important to minimize the risk of heart attack and/or CVA.   The patient's BP and noninvasive studies suggest bilateral renal interventions are widely patent.   The patient will continue the current medications.   The primary medical service will continue aggressive antihypertensive therapy as per the AHA guidelines  - VAS US  RENAL ARTERY DUPLEX; Future  2. Subclavian artery stenosis (HCC) Doing well   Her left arm is asymptomatic and noninvasive studies show the stent is widely patent.    The intervention is patent and therefore no further invasive testing or interventions are indicated at this time.  - VAS US  UPPER EXTREMITY ARTERIAL DUPLEX; Future  3. PAD (peripheral artery disease) (HCC) Recommend:  I do not find evidence of life style limiting vascular disease. The patient specifically denies life style limitation.  Previous noninvasive studies including ABI's of the legs do not identify critical vascular problems.  The patient should continue walking and begin a more formal exercise program. The patient should continue his antiplatelet therapy and aggressive treatment of the lipid abnormalities.  The patient is instructed to call the office if there is a significant change in the lower extremity symptoms, particularly if a wound develops or there is an abrupt increase in leg pain.   4. Coronary artery disease of bypass graft of native heart with stable angina pectoris (HCC) Continue cardiac and antihypertensive medications as already ordered and reviewed, no changes at this  time.  Continue statin as ordered and reviewed, no changes at this time  Nitrates PRN for chest pain  5. HTN (hypertension), benign Continue antihypertensive medications as already ordered, these medications have been reviewed and there are no changes at this time.    Cordella Shawl, MD  01/16/2024 4:06 PM

## 2024-01-18 ENCOUNTER — Ambulatory Visit (INDEPENDENT_AMBULATORY_CARE_PROVIDER_SITE_OTHER): Payer: Medicare PPO

## 2024-01-18 ENCOUNTER — Ambulatory Visit (INDEPENDENT_AMBULATORY_CARE_PROVIDER_SITE_OTHER): Payer: Medicare PPO | Admitting: Vascular Surgery

## 2024-01-18 ENCOUNTER — Encounter (INDEPENDENT_AMBULATORY_CARE_PROVIDER_SITE_OTHER): Payer: Self-pay | Admitting: Vascular Surgery

## 2024-01-18 VITALS — BP 113/58 | HR 49 | Resp 18 | Ht 63.0 in | Wt 105.2 lb

## 2024-01-18 DIAGNOSIS — I25708 Atherosclerosis of coronary artery bypass graft(s), unspecified, with other forms of angina pectoris: Secondary | ICD-10-CM | POA: Diagnosis not present

## 2024-01-18 DIAGNOSIS — I771 Stricture of artery: Secondary | ICD-10-CM | POA: Diagnosis not present

## 2024-01-18 DIAGNOSIS — I739 Peripheral vascular disease, unspecified: Secondary | ICD-10-CM

## 2024-01-18 DIAGNOSIS — I701 Atherosclerosis of renal artery: Secondary | ICD-10-CM

## 2024-01-18 DIAGNOSIS — I10 Essential (primary) hypertension: Secondary | ICD-10-CM

## 2024-04-30 ENCOUNTER — Encounter (INDEPENDENT_AMBULATORY_CARE_PROVIDER_SITE_OTHER): Payer: Self-pay

## 2025-01-16 ENCOUNTER — Other Ambulatory Visit (INDEPENDENT_AMBULATORY_CARE_PROVIDER_SITE_OTHER): Payer: Self-pay | Admitting: Vascular Surgery

## 2025-01-16 DIAGNOSIS — I701 Atherosclerosis of renal artery: Secondary | ICD-10-CM

## 2025-01-16 DIAGNOSIS — I771 Stricture of artery: Secondary | ICD-10-CM

## 2025-01-16 NOTE — Progress Notes (Unsigned)
 "                                                                      MRN : [id number]  Morgan Ryan is a 74 y.o. (11/03/1951) female who presents with chief complaint of check circulation.  History of Present Illness:   The patient returns to the office for followup s/p repeat right renal artery stenting for recurrent renal artery stenosis. There have been no interval changes in the patient's blood pressure control.  She denies any major changes in her medications.  The patient denies headache or flushing.  No flank or unusual back pain.   Patient is also followed for history of left subclavian artery stenosis.  Stent placement was many years ago.  Today she denies claudication of the left arm no vertebrobasilar symptoms.   Procedures: 05/12/2020  Stent right renal artery for recurrence  06/16/2020  Stent left renal artery for recurrence    Remote:    Left subclavian artery stent placement   There have been no significant changes to the patient's overall health care.   No interval shortening of the patient's walking distance or new symptoms consistent with claudication.  In fact the patient is walking approximately 5 miles a day which she keeps track of.  She states that last week she had a total of 40 miles.  The patient denies the  development of rest pain symptoms. No new ulcers or wounds have occurred since the last visit.   Patient denies left arm pain or claudication symptoms.  No vertebral basilar symptoms noted.  The patient denies amaurosis fugax or recent TIA symptoms. There are no recent neurological changes noted.   The patient denies history of DVT, PE or superficial thrombophlebitis. The patient denies recent episodes of angina or shortness of breath.    Duplex ultrasound of the renal arteries obtained today demonstrates widely patent bilateral renal arteries no evidence of in-stent restenosis.  Kidneys remain normal size in the long axis.  No change when compared to  previous study.  Duplex ultrasound of the left upper extremity obtained today demonstrates the subclavian stent appears to be widely patent with normal flow pattern.  No change when compared to previous study.    Active Medications[1]  Past Medical History:  Diagnosis Date   Alopecia    Chronic kidney disease    history nephrolithiasis, renal artery stenosis   Coronary artery disease    GERD (gastroesophageal reflux disease)    History of hiatal hernia    Hyperlipidemia    Hyperparathyroidism    total calcium highest 12.2in 2/05, PTH 67. b. Sestamibi 6/04 revealed no adenoma osteoporosis with t score of -2.67 in femoral neck and    Hypertension    Osteopenia    Osteoporosis    Osteoporosis    Renal artery stenosis    Wears dentures    full upper, partial lower    Past Surgical History:  Procedure Laterality Date   APPENDECTOMY     CATARACT EXTRACTION W/PHACO Right 02/16/2021   Procedure: CATARACT EXTRACTION PHACO AND INTRAOCULAR LENS PLACEMENT (IOC) RIGHT 3.84 00:23.8;  Surgeon: [name], MD;  Location: [facility];  Service: Ophthalmology;  Laterality: Right;   CATARACT EXTRACTION W/PHACO Left 03/02/2021  Procedure: CATARACT EXTRACTION PHACO AND INTRAOCULAR LENS PLACEMENT (IOC) LEFT 5.64 00:33.9;  Surgeon: [name], MD;  Location: [facility];  Service: Ophthalmology;  Laterality: Left;   COLONOSCOPY WITH PROPOFOL  N/A 03/17/2021   Procedure: COLONOSCOPY WITH PROPOFOL ;  Surgeon: Toledo, [name], MD;  Location: [facility];  Service: Gastroenterology;  Laterality: N/A;   CORONARY ARTERY BYPASS GRAFT     ESOPHAGOGASTRODUODENOSCOPY N/A 03/17/2021   Procedure: ESOPHAGOGASTRODUODENOSCOPY (EGD);  Surgeon: Toledo, [name], MD;  Location: [facility];  Service: Gastroenterology;  Laterality: N/A;   ESOPHAGOGASTRODUODENOSCOPY (EGD) WITH PROPOFOL  N/A 03/07/2018   Procedure: ESOPHAGOGASTRODUODENOSCOPY (EGD) WITH PROPOFOL ;  Surgeon: Toledo, [name],  MD;  Location: [facility];  Service: Gastroenterology;  Laterality: N/A;   ESOPHAGOGASTRODUODENOSCOPY (EGD) WITH PROPOFOL  N/A 04/05/2023   Procedure: ESOPHAGOGASTRODUODENOSCOPY (EGD) WITH PROPOFOL ;  Surgeon: Toledo, [name], MD;  Location: [facility];  Service: Gastroenterology;  Laterality: N/A;   PARATHYROIDECTOMY     particial    RENAL ANGIOGRAPHY Right 05/12/2020   Procedure: RENAL ANGIOGRAPHY;  Surgeon: [name], MD;  Location: [facility];  Service: Cardiovascular;  Laterality: Right;   RENAL ANGIOGRAPHY Left 06/16/2020   Procedure: RENAL ANGIOGRAPHY;  Surgeon: [name], MD;  Location: [facility];  Service: Cardiovascular;  Laterality: Left;   STENT PLACEMENT VASCULAR ([facility] HX)     TONSILLECTOMY      Social History Social History[2]  Family History Family History  Problem Relation Age of Onset   Heart disease Mother    Hypertension Father    Hyperlipidemia Father    Cancer Father    Diabetes Father    Heart attack Father    Heart disease Paternal Grandmother     Allergies[3]   REVIEW OF SYSTEMS (Negative unless checked)  Constitutional: [] Weight loss  [] Fever  [] Chills Cardiac: [] Chest pain   [] Chest pressure   [] Palpitations   [] Shortness of breath when laying flat   [] Shortness of breath with exertion. Vascular:  [x] Pain in legs with walking   [] Pain in legs at rest  [] History of DVT   [] Phlebitis   [] Swelling in legs   [] Varicose veins   [] Non-healing ulcers Pulmonary:   [] Uses home oxygen   [] Productive cough   [] Hemoptysis   [] Wheeze  [] COPD   [] Asthma Neurologic:  [] Dizziness   [] Seizures   [] History of stroke   [] History of TIA  [] Aphasia   [] Vissual changes   [] Weakness or numbness in arm   [] Weakness or numbness in leg Musculoskeletal:   [] Joint swelling   [] Joint pain   [] Low back pain Hematologic:  [] Easy bruising  [] Easy bleeding   [] Hypercoagulable state   [] Anemic Gastrointestinal:  [] Diarrhea   [] Vomiting   [] Gastroesophageal reflux/heartburn   [] Difficulty swallowing. Genitourinary:  [] Chronic kidney disease   [] Difficult urination  [] Frequent urination   [] Blood in urine Skin:  [] Rashes   [] Ulcers  Psychological:  [] History of anxiety   []  History of major depression.  Physical Examination  There were no vitals filed for this visit. There is no height or weight on file to calculate BMI. Gen: WD/WN, NAD Head: [place]/AT, No temporalis wasting.  Ear/Nose/Throat: Hearing grossly intact, nares w/o erythema or drainage Eyes: PER, EOMI, sclera nonicteric.  Neck: Supple, no masses.  No bruit or JVD.  Pulmonary:  Good air movement, no audible wheezing, no use of accessory muscles.  Cardiac: RRR, normal S1, S2, no Murmurs. Vascular:  mild trophic changes, no open wounds Vessel Right Left  Radial Palpable Palpable  PT Not Palpable Not Palpable  DP Not Palpable Not Palpable  Gastrointestinal: soft, non-distended. No guarding/no peritoneal signs.  Musculoskeletal: M/S 5/5 throughout.  No visible deformity.  Neurologic: CN 2-12 intact. Pain and light touch intact in extremities.  Symmetrical.  Speech is fluent. Motor exam as listed above. Psychiatric: Judgment intact, Mood & affect appropriate for pt's clinical situation. Dermatologic: No rashes or ulcers noted.  No changes consistent with cellulitis.   CBC No results found for: WBC, HGB, HCT, MCV, PLT  BMET    Component Value Date/Time   BUN 21 06/16/2020 0819   CREATININE 1.01 (H) 06/16/2020 0819   GFRNONAA 58 (L) 06/16/2020 0819   GFRAA >60 06/16/2020 0819   CrCl cannot be calculated (Patient's most recent lab result is older than the maximum 21 days allowed.).  COAG No results found for: INR, PROTIME  Radiology No results found.   Assessment/Plan There are no diagnoses linked to this encounter.   Cordella Shawl, MD  01/16/2025 8:03 AM      [1]  No outpatient medications have been marked as taking for the  01/20/25 encounter (Appointment) with Shawl, Cordella MATSU, MD.  [2]  Social History Tobacco Use   Smoking status: Never   Smokeless tobacco: Never  Vaping Use   Vaping status: Never Used  Substance Use Topics   Alcohol use: Never   Drug use: Never  [3]  Allergies Allergen Reactions   Crestor [Rosuvastatin Calcium] Other (See Comments)    Body pain   Naproxen Nausea And Vomiting   "

## 2025-01-20 ENCOUNTER — Ambulatory Visit (INDEPENDENT_AMBULATORY_CARE_PROVIDER_SITE_OTHER): Payer: Medicare PPO | Admitting: Vascular Surgery

## 2025-01-20 ENCOUNTER — Other Ambulatory Visit (INDEPENDENT_AMBULATORY_CARE_PROVIDER_SITE_OTHER): Payer: Medicare PPO

## 2025-01-20 ENCOUNTER — Encounter (INDEPENDENT_AMBULATORY_CARE_PROVIDER_SITE_OTHER): Payer: Medicare PPO

## 2025-01-20 DIAGNOSIS — I701 Atherosclerosis of renal artery: Secondary | ICD-10-CM

## 2025-01-20 DIAGNOSIS — I25708 Atherosclerosis of coronary artery bypass graft(s), unspecified, with other forms of angina pectoris: Secondary | ICD-10-CM

## 2025-01-20 DIAGNOSIS — I771 Stricture of artery: Secondary | ICD-10-CM

## 2025-01-20 DIAGNOSIS — I739 Peripheral vascular disease, unspecified: Secondary | ICD-10-CM

## 2025-01-20 DIAGNOSIS — I10 Essential (primary) hypertension: Secondary | ICD-10-CM
